# Patient Record
Sex: Female | Born: 1960
Health system: Southern US, Community
[De-identification: ages and names within clinical notes are randomized; demographics above are authoritative.]

## PROBLEM LIST (undated history)

## (undated) DIAGNOSIS — J302 Other seasonal allergic rhinitis: Secondary | ICD-10-CM

## (undated) DIAGNOSIS — K5730 Diverticulosis of large intestine without perforation or abscess without bleeding: Secondary | ICD-10-CM

## (undated) DIAGNOSIS — K219 Gastro-esophageal reflux disease without esophagitis: Secondary | ICD-10-CM

## (undated) DIAGNOSIS — F439 Reaction to severe stress, unspecified: Secondary | ICD-10-CM

## (undated) DIAGNOSIS — G510 Bell's palsy: Secondary | ICD-10-CM

## (undated) DIAGNOSIS — N816 Rectocele: Secondary | ICD-10-CM

## (undated) DIAGNOSIS — R109 Unspecified abdominal pain: Secondary | ICD-10-CM

## (undated) HISTORY — DX: Unspecified abdominal pain: R10.9

## (undated) HISTORY — DX: Reaction to severe stress, unspecified: F43.9

## (undated) HISTORY — DX: Diverticulosis of large intestine without perforation or abscess without bleeding: K57.30

## (undated) HISTORY — PX: TONSILLECTOMY: SUR1361

## (undated) HISTORY — DX: Bell's palsy: G51.0

## (undated) HISTORY — DX: Gastro-esophageal reflux disease without esophagitis: K21.9

## (undated) HISTORY — DX: Rectocele: N81.6

## (undated) HISTORY — DX: Other seasonal allergic rhinitis: J30.2

---

## 1996-02-12 HISTORY — PX: SIGMOIDOSCOPY: SUR1295

## 2003-07-15 ENCOUNTER — Encounter: Admission: RE | Admit: 2003-07-15 | Discharge: 2003-07-15 | Payer: Self-pay | Admitting: Unknown Physician Specialty

## 2004-10-25 ENCOUNTER — Ambulatory Visit (HOSPITAL_COMMUNITY): Admission: RE | Admit: 2004-10-25 | Discharge: 2004-10-25 | Payer: Self-pay | Admitting: Unknown Physician Specialty

## 2005-11-18 ENCOUNTER — Encounter: Admission: RE | Admit: 2005-11-18 | Discharge: 2005-11-18 | Payer: Self-pay | Admitting: Unknown Physician Specialty

## 2007-01-20 ENCOUNTER — Encounter: Admission: RE | Admit: 2007-01-20 | Discharge: 2007-01-20 | Payer: Self-pay | Admitting: Unknown Physician Specialty

## 2007-12-23 ENCOUNTER — Ambulatory Visit: Payer: Self-pay | Admitting: Internal Medicine

## 2008-01-10 HISTORY — PX: COLONOSCOPY: SHX174

## 2008-01-11 ENCOUNTER — Ambulatory Visit: Payer: Self-pay | Admitting: Internal Medicine

## 2008-01-11 ENCOUNTER — Ambulatory Visit (HOSPITAL_COMMUNITY): Admission: RE | Admit: 2008-01-11 | Discharge: 2008-01-11 | Payer: Self-pay | Admitting: Internal Medicine

## 2008-01-11 HISTORY — PX: ESOPHAGOGASTRODUODENOSCOPY: SHX1529

## 2008-01-27 ENCOUNTER — Encounter: Admission: RE | Admit: 2008-01-27 | Discharge: 2008-01-27 | Payer: Self-pay | Admitting: Unknown Physician Specialty

## 2008-02-12 ENCOUNTER — Ambulatory Visit: Payer: Self-pay | Admitting: Internal Medicine

## 2008-02-23 ENCOUNTER — Encounter: Admission: RE | Admit: 2008-02-23 | Discharge: 2008-02-23 | Payer: Self-pay | Admitting: Otolaryngology

## 2008-06-23 ENCOUNTER — Ambulatory Visit (HOSPITAL_COMMUNITY): Admission: RE | Admit: 2008-06-23 | Discharge: 2008-06-23 | Payer: Self-pay | Admitting: Neurology

## 2008-06-29 ENCOUNTER — Encounter (HOSPITAL_COMMUNITY): Admission: RE | Admit: 2008-06-29 | Discharge: 2008-07-29 | Payer: Self-pay | Admitting: Neurology

## 2009-03-06 ENCOUNTER — Encounter: Admission: RE | Admit: 2009-03-06 | Discharge: 2009-03-06 | Payer: Self-pay | Admitting: Unknown Physician Specialty

## 2010-03-27 ENCOUNTER — Encounter
Admission: RE | Admit: 2010-03-27 | Discharge: 2010-03-27 | Payer: Self-pay | Source: Home / Self Care | Attending: Obstetrics and Gynecology | Admitting: Obstetrics and Gynecology

## 2010-04-01 ENCOUNTER — Encounter: Payer: Self-pay | Admitting: Unknown Physician Specialty

## 2010-07-24 NOTE — Assessment & Plan Note (Signed)
NAME:  Dana Clarke, Dana Clarke               CHART#:  [id number]   DATE:  02/12/2008                       DOB:  1960-12-15   Follow up right-sided abdominal pain, intermittent nausea and vomiting.  EGD and ileocolonoscopy on 01/11/2008, demonstrated a small hiatal  hernia and some antral erosions, minimal friable anal rectum, otherwise  normal rectum, pancolonic diverticulum in terminal ileum.  We started  her on Kapidex 60 mg orally daily.  This was associated with marked  improvement in her symptoms nearly complete resolution; however, she ran  out of medication, at the current time she is back on Kapidex.  We  called the prescription in.  She has had some major problems with Bell  palsy and recurrent sinus infections.  She has been on multiple rounds  of antibiotics under the direction of [facility].  She has  had constipation since we performed colonoscopy and says sometimes she  has to digitalize herself to help facilitate a bowel movement.  She  certainly may have a rectocele.  She has been taking over-the-counter  stool softeners.   CURRENT MEDICATIONS:  See updated list.   ALLERGIES:  Aspirin.   PHYSICAL EXAMINATION:  GENERAL:  Today, she appears well.  VITAL SIGNS:  Weight 181, height 5 feet 5 inches, temperature 97.2, BP  118/70, and pulse 84.  SKIN:  Warm and dry.  ABDOMEN:  Flat.  Positive bowel sounds.  Soft and entirely nontender  without appreciable mass or organomegaly.   ASSESSMENT:  Nonspecific abdominal pain follow somewhat to the realm of  dyspepsia.  She has had some recent constipation and recently has  pancolonic diverticulum on recent colonoscopy.  She had a nice dramatic  response to Kapidex 60 mg orally daily, indicating a acid-associated  process.   RECOMMENDATIONS:  1. Would continue Kapidex 60 mg orally daily before breakfast for the      time being.  2. Would add MiraLax 17 g orally at bedtime to regimen for      constipation.  Unless  something comes up, I plan to see this nice      lady back in the office in 3 months.       Jonathon Bellows, M.D.  Electronically Signed     RMR/MEDQ  D:  02/12/2008  T:  02/13/2008  Job:  [id number]   cc:   [facility]

## 2010-07-24 NOTE — Op Note (Signed)
NAME:  Dana Clarke, Dana Clarke              ACCOUNT NO.:  [id number]   MEDICAL RECORD NO.:  [id number]          PATIENT TYPE:  AMB   LOCATION:  DAY                           FACILITY:  [facility]   PHYSICIAN:  R. Roetta Sessions, M.D. DATE OF BIRTH:  Aug 25, 1960   DATE OF PROCEDURE:  01/11/2008  DATE OF DISCHARGE:                               OPERATIVE REPORT   PROCEDURE:  Esophagogastroduodenoscopy diagnostic followed by  ileocolonoscopy.   INDICATIONS FOR PROCEDURE:  A 50 year old lady with longstanding  intermittent right-sided abdominal pain for most part below her  umbilicus on the right, had an extensive evaluation over the years.  She  has intermittent nausea and vomiting without any relative findings on  multitude studies and gallbladder and appendix were in situ.  She is  Hemoccult positive in the office recently.  EGD and colonoscopy  recommended to further evaluate her symptoms.  Risks, benefits,  alternatives, and limitations have been reviewed.  Please see  documentation on the medical record.   PROCEDURE NOTE:  O2 saturation, blood pressure, pulse, and respirations  were monitored throughout the entirety of both procedures.   CONSCIOUS SEDATION:  Versed 7 mg IV and Demerol 125 mg IV in divided  doses.  Cetacaine spray for topical pharyngeal anesthesia.   INSTRUMENTATION:  Pentax video chip system.   FINDINGS:  EGD examination of tubular esophagus revealed no mucosal  abnormality.  EG junction was easily traversed.  Stomach:  Gas cavity  was empty and insufflated well with air.  A thorough examination of the  gastric mucosa including retroflexion view of the proximal stomach and  esophagogastric junction demonstrated only a hiatal hernia and a couple  of tiny antral erosions.  Pylorus was patent and easily traversed.  Examination of the bulb and second portion revealed no abnormalities.   THERAPEUTIC/DIAGNOSTIC MANEUVERS PERFORMED:  None.   The patient tolerated the procedure  well and it was reactive  colonoscopy.  Digital rectal exam revealed no abnormalities.  The scope  was placed.  The prep was adequate.  Colon:  Colonic mucosa was surveyed  from the rectosigmoid junction through the left transverse right colon  to the appendiceal orifice, ileocecal valve, and cecum.  These  structures were well seen and photographed for the record.  Terminal  ileum was intubated to 5 cm.  From this level, the scope was slowly  cautiously withdrawn.  All previous mentioned mucosal surfaces were  again seen.  The patient has scattered pancolonic diverticulum in the  colonic mucosa.  Terminal mucosa appeared entirely normal.  The scope  was pulled down the rectum where thorough examination of the rectal  mucosa including retroflexed view of the anal verge demonstrated  minimally friable anal canal only.  The patient tolerated the procedure  well.  It was reactive endoscopy.   IMPRESSION:  Esophagogastroduodenoscopy.  Normal esophagus, small hiatal  hernia, tiny antral erosions.  Otherwise unremarkable gastric mucosa,  patent pylorus, normal D1 and D2.   COLONOSCOPY FINDINGS:  Friable anal canal, otherwise normal rectum,  pancolonic diverticula.  Remainder of the colonic mucosa and terminal  mucosa appeared normal.  RECOMMENDATIONS:  A trial of proton pump inhibitor therapy in the way of  Capadex or Aciphex 20 or 60 mg respectively daily.  She will get it from  my office free samples.  I will reassess her symptoms in 1 month.  If  she is significantly improved, we will discuss further the possibility  of a diagnostic laparoscopy with incidental appendectomy.      Jonathon Bellows, M.D.  Electronically Signed     RMR/MEDQ  D:  01/11/2008  T:  01/12/2008  Job:  [id number]   cc:   Ruthy Dick  Fax: [phone]   Kirstie Peri, MD  Fax: [phone]

## 2010-07-24 NOTE — Consult Note (Signed)
NAME:  Dana Clarke, Dana Clarke              ACCOUNT NO.:  [id number]   MEDICAL RECORD NO.:  [id number]           PATIENT TYPE:  AMB   LOCATION:  DAY                           FACILITY:  [facility]   PHYSICIAN:  R. Roetta Sessions, M.D. DATE OF BIRTH:  [birth year]   DATE OF CONSULTATION:  DATE OF DISCHARGE:                                 CONSULTATION   REFERRING PHYSICIAN:  Kirstie Peri, MD   REASON FOR CONSULTATION:  Right-sided abdominal pain, nausea, and  vomiting.   HISTORY OF PRESENT ILLNESS:  [name] is a very pleasant 50-  year-old Caucasian female referred on courtesy by Dr. Sherryll Burger to evaluate 2-  month history of intermittent right mid, right lower quadrant abdominal  pain with intermittent associated abdominal distention, nausea, and  vomiting.  She has had symptoms intermittently over the past 2 months.  She was hospitalized for about 4 days at [facility], saw Dr.  Dionicia Abler.  She has had ultrasound of abdomen, CT and MRI of her back as  well as a HIDA scan in the last several weeks as she reports without a  cause for symptoms being found, they do wax and wane, sometimes she can  have some right mid abdominal pain when she steps out of the car and it  subsides.  There is not a radicular component.  She has never developed  a rash.  She denies any alterations in her bowel habits, although she  occasionally wipes a small amount of blood after she has had a bowel  movement.  There has  been no melena.  Again when she has these symptoms  with associated distension, she has nausea and vomiting.  HIDA scan with  a fatty-meal challenge demonstrated gallbladder EF of 90%.  She states  that she got a little nauseated after the half and half but denies that  it reproduced all of her symptoms.  She has never had any intraabdominal  or GYN surgeries.  It is notable in August 2009, she had a recalcitrant  sinus infection and developed Bell palsy.  She took three rounds of  antibiotics as  well as some prednisone around the onset of the above-  mentioned symptoms.   It is notable that this lady has been seen by me back in the late 90s  for right upper quadrant abdominal pain and gallbladder also at that  time revealed no abnormalities.  HIDA demonstrated a gallbladder EF of  53%.  She has not had her upper GI tract evaluated as far as it can be  ascertained.  She did have a flexible sigmoidoscopy for constipation  back in 1997.  She was found have internal hemorrhoids and normal colon  up to 35 cm, an air contrast barium enema followed which revealed only  diverticulosis.  There is no family history of GI neoplasia.  She denies  any weight loss recently.  No odynophagia.  No dysphagia.  No early  satiety.  She does not use any nonsteroidal agents.  She rarely consumes  alcohol.   She tells me that she saw Dr. Marry Guan for  her annual GYN evaluation,  had a negative Pap smear/pelvic in May 2009.   [name] tells me that occasionally, she senses that her right side  abdominal pain as worsened with eating, this tends to eat less.  The  symptoms are seen to be less severe.  Occasionally, she is awakened from  a sound sleep at night with right lower quadrant abdominal pain/right  mid abdominal pain.   PAST MEDICAL HISTORY:  Significant for recurrent right-sided abdominal  pain.  Her only surgeries include tonsillectomy and sigmoidoscopy.   CURRENT MEDICATIONS:  1. Depo-Provera injection every 3 months.  2. Tylenol p.r.n.  3. Vicodin p.r.n.  4. Ambien 10 mg one-quarter tablet at bedtime.  5. She takes probiotic and occasionally takes Zantac for her      indigestion.   ALLERGIES:  No known drug allergies.   FAMILY HISTORY:  No history of chronic GI or liver illness.  Mother  passed when she was 74 with heart related problems.  Father died at age  57 with COPD.   SOCIAL HISTORY:  The patient is single.  She works for the post [employer]  in [place].  No tobacco,  rarely consumes alcohol.  No illicit drugs.   REVIEW OF SYSTEMS:  No chest pain and dyspnea on exertion.  No change in  weight.  No fever, chills, or night sweats.   PHYSICAL EXAMINATION:  GENERAL:  A pleasant 50 year old lady resting  comfortably.  VITAL SIGNS:  Weight 180, height of 5 feet 5 inches, temperature 98.2,  BP 110/74, and pulse 72.  SKIN:  Warm and dry.  HEENT:  No scleral icterus.  Conjunctivae pink.  CHEST:  Lungs are clear to auscultation.  CARDIAC:  Regular rate and rhythm without murmur, gallop, or rub.  BREAST:  Deferred.  ABDOMEN:  Somewhat obese, positive bowel sounds.  She has some vague  right upper mid and right lower quadrant abdominal pain on deep  palpation.  No appreciable mass or organomegaly.  EXTREMITIES:  No edema.  RECTAL:  No external lesions.  No mass at the rectal vault appreciated.  No stool in rectal vault.  Mucus is a hemoccult positive.   IMPRESSION:  [name] is a very pleasant 50 year old lady who  has varying degrees of right-sided abdominal pain for over a decade.  She has had recurrence of symptoms over the past two months which she  differentiate somewhat from her pain that she had done back in the 90s.  It sounds as though she has had a multitude of diagnostic studies and  saw Dr. Dionicia Abler when she was hospitalized briefly recently as well.   At this point, her some symptoms  not at all sound that they are  emanating from her biliary tract.  It will be hard press to recommend  that she get her gallbladder at this point in time.  It almost sounds  like she may be having intermittent low grade small-bowel obstruction.  It is also possible that she may have an element of chronic  appendicitis.  At any rate, her symptomatology has been over a  relatively long period of time and is somewhat reassuring that no  significant pathology has been found to date.  Her symptoms will be  somewhat atypical for peptic ulcer disease, although  she has not had her  upper GI tract evaluated.   She is hemoccult positive today and is almost at threshold age for  colorectal cancer screening.   RECOMMENDATIONS:  I  have told [name] we ought to go ahead and  perform both an EGD and an ileal colonoscopy in the near future at [facility].  Risks, benefits, and alternatives have been reviewed.  If endoscopic evaluation is unrevealing, after I have reviewed the CT  scan from [facility], we may be looking at ultimately offering  this lady a diagnostic laparoscopy with incidental appendectomy to  complete the workup/evaluation of her GI symptoms.  Risks, benefits,  alternatives, and limitations of EGD and colonoscopy have been reviewed  with [name], questions are answered.  She is agreeable.  We will  plan to perform EGD and colonoscopy in near future at [facility].   I thank Dr. Sherryll Burger for allowing me to see this very nice lady once again  in consultation.      Jonathon Bellows, M.D.  Electronically Signed     RMR/MEDQ  D:  12/23/2007  T:  12/24/2007  Job:  [id number]   cc:   Kirstie Peri, MD  Fax: [phone]

## 2011-05-13 ENCOUNTER — Other Ambulatory Visit: Payer: Self-pay | Admitting: Family Medicine

## 2011-05-13 DIAGNOSIS — Z1231 Encounter for screening mammogram for malignant neoplasm of breast: Secondary | ICD-10-CM

## 2011-05-27 ENCOUNTER — Ambulatory Visit
Admission: RE | Admit: 2011-05-27 | Discharge: 2011-05-27 | Disposition: A | Discharge status: Home / Self Care | Payer: Federal, State, Local not specified - PPO | Source: Ambulatory Visit | Attending: Family Medicine | Admitting: Family Medicine

## 2011-05-27 ENCOUNTER — Other Ambulatory Visit: Payer: Self-pay | Admitting: Unknown Physician Specialty

## 2011-05-27 DIAGNOSIS — Z1231 Encounter for screening mammogram for malignant neoplasm of breast: Secondary | ICD-10-CM

## 2011-08-06 ENCOUNTER — Telehealth: Payer: Self-pay

## 2011-08-06 NOTE — Telephone Encounter (Signed)
Pt called- she is having some problems with constipation and diarrhea, she has been taking stool softeners. . She has seen her OB/Gyn recently and he did a rectal exam and told her that she has a place in her rectum that is about the size of a dime, he recommended she try miralax. She has not noticed any blood but thinks she may have had something that looked like coffee grounds in her stool. Advised pt that she has not been seen since 2009 and she will need an ov.   Darl Pikes, please schedule pt for ov.

## 2011-08-07 NOTE — Telephone Encounter (Signed)
Pt is aware of OV on 6/11 at 1030 with LSL

## 2011-08-14 ENCOUNTER — Encounter: Payer: Self-pay | Admitting: Internal Medicine

## 2011-08-20 ENCOUNTER — Ambulatory Visit (INDEPENDENT_AMBULATORY_CARE_PROVIDER_SITE_OTHER): Payer: Federal, State, Local not specified - PPO | Admitting: Gastroenterology

## 2011-08-20 ENCOUNTER — Encounter: Payer: Self-pay | Admitting: Gastroenterology

## 2011-08-20 VITALS — BP 119/68 | HR 57 | Temp 98.2°F | Ht 64.0 in | Wt 206.4 lb

## 2011-08-20 DIAGNOSIS — N816 Rectocele: Secondary | ICD-10-CM

## 2011-08-20 DIAGNOSIS — K625 Hemorrhage of anus and rectum: Secondary | ICD-10-CM

## 2011-08-20 DIAGNOSIS — K5900 Constipation, unspecified: Secondary | ICD-10-CM

## 2011-08-20 DIAGNOSIS — R197 Diarrhea, unspecified: Secondary | ICD-10-CM

## 2011-08-20 NOTE — Patient Instructions (Signed)
Please take Miralax 17g (one capful) daily for next 30 days. You can hold for diarrhea. Please keep stool diary documenting number of stool daily, if formed or loose, if you had to help the stool out by applying pressure.

## 2011-08-20 NOTE — Assessment & Plan Note (Signed)
Rare BRBPR with passing hard stool.

## 2011-08-20 NOTE — Progress Notes (Signed)
Faxed to PCP

## 2011-08-20 NOTE — Progress Notes (Signed)
Primary Care Physician:  Kirstie Peri, MD, MD  Primary Gastroenterologist:  Roetta Sessions, MD   Chief Complaint  Patient presents with  . Follow-up    HPI:  Dana Clarke is a 51 y.o. female here for evaluation of abnormal rectal exam by her gynecologist, Dr. Mora Appl. Patient made appointment herself, not referred.  Patient reports taking Colace off and on for years. If forget stool softner, then may have constipation. She may go from normal bowel movement to hard BM. Usually have 2-3 loose stools following hard stools. She finds that she is not emptying completely and often applies pressure between the vagina and anus in perineal area to facilitate bowel movements. Has to do this even when stools are loose. Occasional BRBPR with constipation. Still having some RLQ pain but not as bad. Sometimes worse with constipation. Occ heartburn, managed with TUMS. No dysphagia. No vomiting. Weight up 25 pounds since 2009. She reports weight gain after on steroids for about a year. Has been unable to loose the weight. C/O stress related to having three young children in the house. She takes care of them while their mother (patient's daughter) is at work on 2nd shift.    Current Outpatient Prescriptions  Medication Sig Dispense Refill  . cetirizine (ZYRTEC) 10 MG chewable tablet Chew 10 mg by mouth daily.      . citalopram (CELEXA) 20 MG tablet Take 20 mg by mouth daily.       . medroxyPROGESTERone (DEPO-PROVERA) 150 MG/ML injection Inject 150 mg into the muscle every 3 (three) months.       . zolpidem (AMBIEN) 10 MG tablet Take 10 mg by mouth at bedtime as needed. 1/4 of a pill        Allergies as of 08/20/2011 - Review Complete 08/20/2011  Allergen Reaction Noted  . Codeine Itching 08/20/2011    Past Medical History  Diagnosis Date  . Abdominal pain     chronic right sided abd pain, gallbladder w/u negative in 2009 (u/s and HIDA)  . Bell's palsy   . Stress     Past Surgical History    Procedure Date  . Esophagogastroduodenoscopy 01/11/2008     Normal esophagus, small hiatal hernia, tiny antral erosions.  Otherwise unremarkable gastric mucosa, patent pylorus, normal D1 and D2.   . Tonsillectomy   . Sigmoidoscopy 02/12/96    interenal hemorrhoids  . Colonoscopy 01/2008    friable anal canal, pancolonic diverticula, normal terminal ileium    Family History  Problem Relation Age of Onset  . COPD Father     deceased age 88  . Heart disease Mother     deceased age 80s  . Diabetes Mother   . Colon cancer Neg Hx   . Cirrhosis Brother     etoh  . Breast cancer Maternal Aunt     2 maternal great aunt.  . Breast cancer Paternal Aunt     History   Social History  . Marital Status: Divorced    Spouse Name: N/A    Number of Children: 3  . Years of Education: N/A   Occupational History  .  [employer]   Social History Main Topics  . Smoking status: Never Smoker   . Smokeless tobacco: Not on file  . Alcohol Use: Yes     2 glass of wine a week  . Drug Use: No  . Sexually Active: Not on file   Other Topics Concern  . Not on file   Social History  Narrative  . No narrative on file      ROS:  General: Negative for anorexia, weight loss, fever, chills, fatigue, weakness. Eyes: Negative for vision changes.  ENT: Negative for hoarseness, difficulty swallowing , nasal congestion. CV: Negative for chest pain, angina, palpitations, dyspnea on exertion, peripheral edema.  Respiratory: Negative for dyspnea at rest, dyspnea on exertion, cough, sputum, wheezing.  GI: See history of present illness. GU:  Negative for dysuria, hematuria, urinary incontinence, urinary frequency, nocturnal urination.  MS: Negative for joint pain, low back pain.  Derm: Negative for rash or itching.  Neuro: Negative for weakness, abnormal sensation, seizure, frequent headaches, memory loss, confusion.  Psych: Negative for anxiety, depression, suicidal ideation, hallucinations.   Endo: Negative for unusual weight change.  Heme: Negative for bruising or bleeding. Allergy: Negative for rash or hives.    Physical Examination:  BP 119/68  Pulse 57  Temp(Src) 98.2 F (36.8 C) (Temporal)  Ht 5\' 4"  (1.626 m)  Wt 206 lb 6.4 oz (93.622 kg)  BMI 35.43 kg/m2   General: Well-nourished, well-developed in no acute distress.  Head: Normocephalic, atraumatic.   Eyes: Conjunctiva pink, no icterus. Mouth: Oropharyngeal mucosa moist and pink , no lesions erythema or exudate. Neck: Supple without thyromegaly, masses, or lymphadenopathy.  Lungs: Clear to auscultation bilaterally.  Heart: Regular rate and rhythm, no murmurs rubs or gallops.  Abdomen: Bowel sounds are normal, nontender, nondistended, no hepatosplenomegaly or masses, no abdominal bruits or    hernia , no rebound or guarding.   Rectal: ext hemorrhoids, no inflammation. Nontender, DRE. Small brown secretions heme negative. No masses.  Extremities: No lower extremity edema. No clubbing or deformities.  Neuro: Alert and oriented x 4 , grossly normal neurologically.  Skin: Warm and dry, no rash or jaundice.   Psych: Alert and cooperative, normal mood and affect.

## 2011-08-20 NOTE — Assessment & Plan Note (Signed)
Alternating constipation and diarrhea with incomplete evacuation requiring perineal pressure/hand manipulation. Suspect rectocele. Patient reports that her gyn examined her and found a "hole" from she tore during childbirth.   Add miralax one capful daily to try and keep stools soft and easy to pass. She will keep stool diary. Ifobt. If no improvement in symptoms and/or ifobt positive, we would offer her colonoscopy. OV in 6 weeks.

## 2011-09-18 ENCOUNTER — Ambulatory Visit (INDEPENDENT_AMBULATORY_CARE_PROVIDER_SITE_OTHER): Payer: Federal, State, Local not specified - PPO | Admitting: Gastroenterology

## 2011-09-18 DIAGNOSIS — K625 Hemorrhage of anus and rectum: Secondary | ICD-10-CM

## 2011-09-20 NOTE — Progress Notes (Signed)
Quick Note:  ifobt negative. Keep f/u ov as planned. ______

## 2011-10-07 ENCOUNTER — Ambulatory Visit: Payer: Federal, State, Local not specified - PPO | Admitting: Gastroenterology

## 2011-10-10 NOTE — Progress Notes (Signed)
REVIEWED.  

## 2011-12-24 ENCOUNTER — Ambulatory Visit (HOSPITAL_COMMUNITY)
Admission: RE | Admit: 2011-12-24 | Discharge: 2011-12-24 | Disposition: A | Discharge status: Home / Self Care | Payer: Federal, State, Local not specified - PPO | Source: Ambulatory Visit | Attending: Internal Medicine | Admitting: Internal Medicine

## 2011-12-24 ENCOUNTER — Ambulatory Visit (INDEPENDENT_AMBULATORY_CARE_PROVIDER_SITE_OTHER): Payer: Federal, State, Local not specified - PPO | Admitting: Internal Medicine

## 2011-12-24 ENCOUNTER — Encounter: Payer: Self-pay | Admitting: Internal Medicine

## 2011-12-24 VITALS — BP 125/68 | HR 59 | Temp 97.6°F | Ht 64.0 in | Wt 203.8 lb

## 2011-12-24 DIAGNOSIS — M51379 Other intervertebral disc degeneration, lumbosacral region without mention of lumbar back pain or lower extremity pain: Secondary | ICD-10-CM | POA: Insufficient documentation

## 2011-12-24 DIAGNOSIS — R109 Unspecified abdominal pain: Secondary | ICD-10-CM

## 2011-12-24 DIAGNOSIS — K5730 Diverticulosis of large intestine without perforation or abscess without bleeding: Secondary | ICD-10-CM | POA: Insufficient documentation

## 2011-12-24 DIAGNOSIS — M5137 Other intervertebral disc degeneration, lumbosacral region: Secondary | ICD-10-CM | POA: Insufficient documentation

## 2011-12-24 MED ORDER — IOHEXOL 300 MG/ML  SOLN
100.0000 mL | Freq: Once | INTRAMUSCULAR | Status: AC | PRN
  Administered 2011-12-24: 100 mL via INTRAVENOUS

## 2011-12-24 NOTE — Progress Notes (Signed)
Primary Care Physician:  Kirstie Peri, MD Primary Gastroenterologist:  Dr. Jena Gauss  Pre-Procedure History & Physical: HPI:  Dana Clarke is a 51 y.o. female here for followup chronic constipation and probable rectocele. Bowel symptoms settled down on a regimen of simple Colace. Not taking MiraLax. Complains of recurrence of right-sided abdominal pain which she states she's had since she was 50 years old. This lasted for a longer period time and now is somewhat worse than prior episodes specifically talks about migratory umbilical / right upper quadrant to flank radiation and then in the opposite direction from time to time. Not affected by bowel function or eating. Does not truly have a radicular component. Does not have a positional component. She has her appendix and gallbladder. Was told years ago she had a bulging disc (possible MRI [facility] hospital); no recent CT scanning; negative colonoscopy here in 2009.  She has not lost any weight. Again, pain not affected by eating or fasting. No fever chills or urinary tract symptoms.   Recent digital rectal exam and Hemoccult testing negative through this office.  Past Medical History  Diagnosis Date  . Abdominal pain     chronic right sided abd pain, gallbladder w/u negative in 2009 (u/s and HIDA)  . Bell's palsy   . Stress   . Diverticula of colon   . Hiatal hernia   . Rectocele     Past Surgical History  Procedure Date  . Esophagogastroduodenoscopy 01/11/2008     Normal esophagus, small hiatal hernia, tiny antral erosions.  Otherwise unremarkable gastric mucosa, patent pylorus, normal D1 and D2.   . Tonsillectomy   . Sigmoidoscopy 02/12/96    interenal hemorrhoids  . Colonoscopy 01/2008    friable anal canal, pancolonic diverticula, normal terminal ileium    Prior to Admission medications   Medication Sig Start Date End Date Taking? Authorizing Provider  cetirizine (ZYRTEC) 10 MG chewable tablet Chew 10 mg by mouth daily.   Yes  Historical Provider, MD  citalopram (CELEXA) 20 MG tablet Take 20 mg by mouth daily.  08/19/11  Yes Historical Provider, MD  medroxyPROGESTERone (DEPO-PROVERA) 150 MG/ML injection Inject 150 mg into the muscle every 3 (three) months.  08/19/11  Yes Historical Provider, MD  zolpidem (AMBIEN) 10 MG tablet Take 10 mg by mouth at bedtime as needed. 1/4 of a pill   Yes Historical Provider, MD    Allergies as of 12/24/2011 - Review Complete 12/24/2011  Allergen Reaction Noted  . Codeine Itching 08/20/2011    Family History  Problem Relation Age of Onset  . COPD Father     deceased age 24  . Heart disease Mother     deceased age 31s  . Diabetes Mother   . Colon cancer Neg Hx   . Cirrhosis Brother     etoh  . Breast cancer Maternal Aunt     2 maternal great aunt.  . Breast cancer Paternal Aunt     History   Social History  . Marital Status: Divorced    Spouse Name: N/A    Number of Children: 3  . Years of Education: N/A   Occupational History  .  [employer]   Social History Main Topics  . Smoking status: Never Smoker   . Smokeless tobacco: Not on file  . Alcohol Use: Yes     2 glass of wine a week  . Drug Use: No  . Sexually Active: Not on file   Other Topics Concern  . Not  on file   Social History Narrative  . No narrative on file    Review of Systems: See HPI, otherwise negative ROS  Physical Exam: BP 125/68  Pulse 59  Temp 97.6 F (36.4 C) (Temporal)  Ht 5\' 4"  (1.626 m)  Wt 203 lb 12.8 oz (92.443 kg)  BMI 34.98 kg/m2 General:   Obese Alert,  Well-developed, well-nourished, pleasant and cooperative in NAD Skin:  Intact without significant lesions or rashes. Eyes:  Sclera clear, no icterus.   Conjunctiva pink. Ears:  Normal auditory acuity. Nose:  No deformity, discharge,  or lesions. Mouth:  No deformity or lesions. Neck:  Supple; no masses or thyromegaly. No significant cervical adenopathy. Lungs:  Clear throughout to auscultation.   No wheezes,  crackles, or rhonchi. No acute distress. Heart:  Regular rate and rhythm; no murmurs, clicks, rubs,  or gallops. Abdomen: Non-distended, normal bowel sounds.  Soft and nontender without appreciable mass or hepatosplenomegaly.  Pulses:  Normal pulses noted. Extremities:  Without clubbing or edema. No CVA tenderness. Impression/Plan:  A 51 year old lady with chronic constipation now with recurrence of right-sided abdominal and flank pain. Symptoms somewhat non-specific. Does not truly sound radicular in origin nor does it sound musculoskeletal primarily. Adhesive disease and/or chronic appendicitis would remain in the differential. I doubt gallbladder pathology. An occult hernia would remain in the differential. It's been years since she last had a CT scan or other cross-sectional abdominal imaging  Recommendations: Contrast CT of the abdomen and pelvis in the near future. Retrieve the MRI spine report done at [facility] do previously  Further recommendations to follow.

## 2011-12-24 NOTE — Patient Instructions (Addendum)
IV/oral contrast CT of abdomen and pelvis to further evaluate right sided abdominal pain.  Need MRI spine report done at [facility] a few years ago.

## 2012-01-06 ENCOUNTER — Telehealth: Payer: Self-pay | Admitting: *Deleted

## 2012-01-06 NOTE — Telephone Encounter (Signed)
CT shows no intra-abdominal abnormalities to explain right-sided abdominal pain. She does have an abnormal spine beginning at L5-too low, however, to explain right-sided abdominal pain.  I do not see a copy of the MRI report from [facility] hospital done a little while back in our EMR. Patient should have an early interval followup with one of the extenders in the next 1-2 weeks (3 constipation, pain, etc.).

## 2012-01-06 NOTE — Telephone Encounter (Signed)
Routed to RMR 

## 2012-01-06 NOTE — Telephone Encounter (Signed)
Dana Clarke called today regarding her results from her recent CT scan.  She would like a call back, asap. Thanks.

## 2012-01-07 NOTE — Telephone Encounter (Signed)
Tried to call pt- LMOM 

## 2012-01-13 ENCOUNTER — Telehealth: Payer: Self-pay

## 2012-01-13 NOTE — Telephone Encounter (Signed)
Pt called back today to get the results of her CT scan. I gave her the results of the CT. She is still hurting a little bit. She did have some x-rays at [facility] that we was trying to get.

## 2012-02-04 ENCOUNTER — Encounter: Payer: Self-pay | Admitting: Internal Medicine

## 2012-02-04 NOTE — Telephone Encounter (Signed)
Mailed appt card to patient with OV on 12/5 at 0930 with AS

## 2012-02-12 ENCOUNTER — Encounter: Payer: Self-pay | Admitting: Internal Medicine

## 2012-02-13 ENCOUNTER — Ambulatory Visit: Payer: Federal, State, Local not specified - PPO | Admitting: Gastroenterology

## 2012-03-16 LAB — COMPREHENSIVE METABOLIC PANEL
ALT: 14 U/L (ref 7–35)
AST: 12 U/L
Albumin: 3.8
Alkaline Phosphatase: 53 U/L
Total Bilirubin: 0.4 mg/dL
Total Protein: 6.6 g/dL

## 2012-03-16 LAB — CBC
HCT: 38 %
MCHC: 32
RDW: 12.8
platelet count: 262

## 2012-04-06 ENCOUNTER — Ambulatory Visit: Payer: Federal, State, Local not specified - PPO | Admitting: Gastroenterology

## 2012-04-10 ENCOUNTER — Encounter: Payer: Self-pay | Admitting: Internal Medicine

## 2012-04-13 ENCOUNTER — Ambulatory Visit (INDEPENDENT_AMBULATORY_CARE_PROVIDER_SITE_OTHER): Payer: Federal, State, Local not specified - PPO | Admitting: Gastroenterology

## 2012-04-13 ENCOUNTER — Encounter: Payer: Self-pay | Admitting: Gastroenterology

## 2012-04-13 VITALS — BP 119/71 | HR 70 | Temp 97.0°F | Ht 64.5 in | Wt 204.2 lb

## 2012-04-13 DIAGNOSIS — R109 Unspecified abdominal pain: Secondary | ICD-10-CM

## 2012-04-13 DIAGNOSIS — K219 Gastro-esophageal reflux disease without esophagitis: Secondary | ICD-10-CM | POA: Insufficient documentation

## 2012-04-13 NOTE — Patient Instructions (Addendum)
Please review the low-fat diet and the reflux diet.  We will see you back in 3 months.  Please call if things do not start to improve in the near future, and I will see you sooner  Diet for Gastroesophageal Reflux Disease, Adult Reflux (acid reflux) is when acid from your stomach flows up into the esophagus. When acid comes in contact with the esophagus, the acid causes irritation and soreness (inflammation) in the esophagus. When reflux happens often or so severely that it causes damage to the esophagus, it is called gastroesophageal reflux disease (GERD). Nutrition therapy can help ease the discomfort of GERD. FOODS OR DRINKS TO AVOID OR LIMIT  Smoking or chewing tobacco. Nicotine is one of the most potent stimulants to acid production in the gastrointestinal tract.   Caffeinated and decaffeinated coffee and black tea.   Regular or low-calorie carbonated beverages or energy drinks (caffeine-free carbonated beverages are allowed).     Strong spices, such as black pepper, white pepper, red pepper, cayenne, curry powder, and chili powder.   Peppermint or spearmint.   Chocolate.   High-fat foods, including meats and fried foods. Extra added fats including oils, butter, salad dressings, and nuts. Limit these to less than 8 tsp per day.   Fruits and vegetables if they are not tolerated, such as citrus fruits or tomatoes.   Alcohol.   Any food that seems to aggravate your condition.  If you have questions regarding your diet, call your caregiver or a registered dietitian. OTHER THINGS THAT MAY HELP GERD INCLUDE:    Eating your meals slowly, in a relaxed setting.   Eating 5 to 6 small meals per day instead of 3 large meals.   Eliminating food for a period of time if it causes distress.   Not lying down until 3 hours after eating a meal.   Keeping the head of your bed raised 6 to 9 inches (15 to 23 cm) by using a foam wedge or blocks under the legs of the bed. Lying flat may make  symptoms worse.   Being physically active. Weight loss may be helpful in reducing reflux in overweight or obese adults.   Wear loose fitting clothing  EXAMPLE MEAL PLAN This meal plan is approximately 2,000 calories based on https://www.bernard.org/ meal planning guidelines. Breakfast   cup cooked oatmeal.   1 cup strawberries.   1 cup low-fat milk.   1 oz almonds.  Snack  1 cup cucumber slices.   6 oz yogurt (made from low-fat or fat-free milk).  Lunch  2 slice whole-wheat bread.   2 oz sliced [place].   2 tsp mayonnaise.   1 cup blueberries.   1 cup snap peas.  Snack  6 whole-wheat crackers.   1 oz string cheese.  Dinner   cup brown rice.   1 cup mixed veggies.   1 tsp olive oil.   3 oz grilled fish.  Document Released: 02/25/2005 Document Revised: 05/20/2011 Document Reviewed: 01/11/2011 [facility] Patient Information 2013 [place], [place].

## 2012-04-13 NOTE — Assessment & Plan Note (Signed)
Intermittent, r/t food choices. Low-fat diet and reflux diet provided. 3 mos return. Hesitant to start PPI, although she very well may need this in the future.

## 2012-04-13 NOTE — Progress Notes (Signed)
Faxed to PCP

## 2012-04-13 NOTE — Assessment & Plan Note (Signed)
52 year old female with hx of chronic abdominal pain, now noting RUQ pain, intermittent, associated nausea. No exacerbation with food. [place] of abdomen without stones, LFTs normal. Lipase normal. Prior work-up for biliary dyskinesia negative in 2009. Unable to rule out biliary component, may be dealing with underlying gastritis but more likely chronic abdominal pain. Interestingly, she was tender to palpation in the lower right ribs, intercostal space. Instructed to follow-up with PCP regarding this. I offered an updated HIDA scan, but she would like to attempt a low-fat diet and reflux diet first. I have provided her both of these handouts. We will see her back in 3 months. She is hesitant to start a PPI.

## 2012-04-13 NOTE — Progress Notes (Signed)
Referring Provider: Kirstie Peri, MD Primary Care Physician:  Kirstie Peri, MD Primary Gastroenterologist: Dr. Jena Gauss   Chief Complaint  Patient presents with  . Abdominal Pain    HPI:    Ms. Hinz is a pleasant 52 year old female who presents today with a hx of constipation, rectocele, and abdominal pain. Last seen by Dr. Jena Gauss in October 2013. At that time, she had noted recurrence of right-sided abdominal pain, which has been present since she was about 12. CT at that time with colonic diverticulosis, severe DDD L5-S1. Nothing to explain her symptoms.   She returns today stating that pain is now RUQ. Radiates to right flank. Went to the ED early January, felt like she was having a baby. Intermittent. No real association with food. +nausea associated with pain. Denies bloating. Appetite is ok, no difference with this. Intermittent heartburn, takes a home remedy. Uses vinegar, water, baking soda. Eats Tums. Maybe a few times per week. Doesn't want to take Prevacid because her mom has osteoporosis. Usually only greasy foods worsen reflux. States had a big BM then diarrhea. Usually has a BM once to three times per day. Sometimes hard then runny, sometimes normal. Not eating high fiber diet. No supplemental fiber.   Has taken Ibuprofen the last few days for carpal tunnel. Takes rarely. No melena or hematochezia.   [place] of abdomen at [facility] Jan 2014: normal gallbladder, no stones, CBD normal. Liver normal.  LFTs normal, lipase normal. CBC without anemia or leukocytosis.   ifobt negative July 2013.   Past Medical History  Diagnosis Date  . Abdominal pain     chronic right sided abd pain, gallbladder w/u negative in 2009 (u/s and HIDA)  . Bell's palsy   . Stress   . Diverticula of colon   . Hiatal hernia   . Rectocele     Past Surgical History  Procedure Date  . Esophagogastroduodenoscopy 01/11/2008     [id number] esophagus, small hiatal hernia, tiny antral erosions.  Otherwise  unremarkable gastric mucosa, patent pylorus, normal D1 and D2.   . Tonsillectomy   . Sigmoidoscopy 02/12/96    interenal hemorrhoids  . Colonoscopy 01/2008    [id number] anal canal, pancolonic diverticula, normal terminal ileium    Current Outpatient Prescriptions  Medication Sig Dispense Refill  . cetirizine (ZYRTEC) 10 MG chewable tablet Chew 10 mg by mouth daily.      . citalopram (CELEXA) 20 MG tablet Take 20 mg by mouth daily.       Marland Kitchen docusate sodium (COLACE) 100 MG capsule Take 100 mg by mouth daily. 3 tablets a day      . loratadine (CLARITIN) 10 MG tablet Take 10 mg by mouth daily.      . medroxyPROGESTERone (DEPO-PROVERA) 150 MG/ML injection Inject 150 mg into the muscle every 3 (three) months.       . zolpidem (AMBIEN) 10 MG tablet Take 10 mg by mouth at bedtime as needed. 1/4 of a pill        Allergies as of 04/13/2012 - Review Complete 04/13/2012  Allergen Reaction Noted  . Codeine Itching 08/20/2011    Family History  Problem Relation Age of Onset  . COPD Father     deceased age 63  . Heart disease Mother     deceased age 68s  . Diabetes Mother   . Colon cancer Neg Hx   . Cirrhosis Brother     etoh  . Breast cancer Maternal Aunt     2 maternal great aunt.  Marland Kitchen  Breast cancer Paternal Aunt     History   Social History  . Marital Status: Divorced    Spouse Name: N/A    Number of Children: 3  . Years of Education: N/A   Occupational History  .  [employer]   Social History Main Topics  . Smoking status: Never Smoker   . Smokeless tobacco: None  . Alcohol Use: Yes     Comment: 2 glass of wine a week  . Drug Use: No  . Sexually Active: None   Other Topics Concern  . None   Social History Narrative  . None    Review of Systems: Gen: SEE HPI CV: Denies chest pain, palpitations, syncope, peripheral edema, and claudication. Resp: +sinus infection  GI: SEE HPI Derm: Denies rash, itching, dry skin Psych: +anxiety, 3 grandkids living with her and  daughter Heme: Denies bruising, bleeding, and enlarged lymph nodes.  Physical Exam: BP 119/71  Pulse 70  Temp 97 F (36.1 C) (Oral)  Ht 5' 4.5" (1.638 m)  Wt 204 lb 3.2 oz (92.625 kg)  BMI 34.51 kg/m2 General:   Alert and oriented. No distress noted. Pleasant and cooperative.  Head:  Normocephalic and atraumatic. Eyes:  Conjuctiva clear without scleral icterus. Mouth:  Oral mucosa pink and moist. Good dentition. No lesions. Heart:  S1, S2 present without murmurs, rubs, or gallops. Regular rate and rhythm. Abdomen:  +BS, soft, mild TTP epigastric, right intercostal margins, and non-distended. No rebound or guarding. No HSM or masses noted. Msk:  Symmetrical without gross deformities. Normal posture. Extremities:  Without edema. Neurologic:  Alert and  oriented x4;  grossly normal neurologically. Skin:  Intact without significant lesions or rashes. Psych:  Alert and cooperative. Normal mood and affect.

## 2012-05-22 ENCOUNTER — Encounter: Payer: Self-pay | Admitting: Internal Medicine

## 2012-07-15 ENCOUNTER — Ambulatory Visit: Payer: Federal, State, Local not specified - PPO | Admitting: Gastroenterology

## 2012-10-08 ENCOUNTER — Other Ambulatory Visit: Payer: Self-pay

## 2012-10-08 ENCOUNTER — Telehealth: Payer: Self-pay | Admitting: *Deleted

## 2012-10-08 DIAGNOSIS — R109 Unspecified abdominal pain: Secondary | ICD-10-CM

## 2012-10-08 DIAGNOSIS — R197 Diarrhea, unspecified: Secondary | ICD-10-CM

## 2012-10-08 DIAGNOSIS — K219 Gastro-esophageal reflux disease without esophagitis: Secondary | ICD-10-CM

## 2012-10-08 NOTE — Telephone Encounter (Signed)
Talked with patient and she said that she will good to [facility] and have the blood work done in the morning. I am going to fax the orders over to them. I also made her an appointment for Tuesday at 11:30. I told her that if it get worst to go to the ER.

## 2012-10-08 NOTE — Telephone Encounter (Signed)
Spoke with pt- she had diarrhea x 3 weeks, she then got slightly constipated. Last night she had greasy food for dinner and woke up at 2am with vomiting and watery diarrhea and abd pain all over (not one specific spot) she feels like she is swollen under her R breast. The only thing she has been able to keep down is a small amount of pepsi. She is concerned about her gallbladder or possible blockage. She wants to be seen soon or wants to know if we can call in something. She said she had a recent gallbladder u/s.  Please advise.

## 2012-10-08 NOTE — Telephone Encounter (Signed)
We can check HFP, lipase, followed bland diet for now. Will likely need HIDA scan in near future.  May offer a visit for next week.  Has hx of chronic abdominal pain.

## 2012-10-08 NOTE — Telephone Encounter (Signed)
Pt called stating she has been having d/v all night and wanting to be seen soon. Please advise.

## 2012-10-13 ENCOUNTER — Ambulatory Visit (INDEPENDENT_AMBULATORY_CARE_PROVIDER_SITE_OTHER): Payer: Federal, State, Local not specified - PPO | Admitting: Gastroenterology

## 2012-10-13 ENCOUNTER — Telehealth: Payer: Self-pay | Admitting: *Deleted

## 2012-10-13 ENCOUNTER — Encounter: Payer: Self-pay | Admitting: Gastroenterology

## 2012-10-13 VITALS — BP 133/78 | HR 54 | Temp 99.2°F | Ht 64.0 in | Wt 203.0 lb

## 2012-10-13 DIAGNOSIS — R109 Unspecified abdominal pain: Secondary | ICD-10-CM

## 2012-10-13 NOTE — Telephone Encounter (Signed)
Received

## 2012-10-13 NOTE — Telephone Encounter (Signed)
Dana Clarke from [facility] cannot find hida scan

## 2012-10-13 NOTE — Patient Instructions (Addendum)
Start taking Dexilant each morning with breakfast.   I am requesting records from [place] before we do any further tests.  We will be in touch shortly!

## 2012-10-13 NOTE — Progress Notes (Signed)
Referring Provider: Kirstie Peri, MD Primary Care Physician:  Kirstie Peri, MD Primary GI: Dr. Jena Gauss   Chief Complaint  Patient presents with  . Abdominal Pain    ? gallbladder    HPI:   52 year old well-known female to our practice, presents today due to abdominal pain. GI history significant for constipation as well. Outside [place] of abdomen Jan 2014 at [facility] normal. Prior work-up for biliary dyskinesia negative in 2009. Last HIDA in 2009 with 92% EF; however, she did experience RUQ pain and reproduction of symptoms after ingesting half and half. Offered HIDA scan at last appt in Feb 2014, but she wanted to attempt a low-fat diet and GERD diet first. Was hesitant to start a PPI at that time. I ordered HFP, lipase July 31 after phone call. Lipase normal at 21. HFP normal. Will be abstracted.   Presents today stating it took a few days to get over the RUQ/upper abdominal pain. Had eaten a bacon, egg, tomato sandwich prior, cooking it in bacon grease. Woke up with vomiting and diarrhea. Resulted in severe soreness. Couldn't move for a few days, occasionally feels swelling in RUQ. Stools softer. Some loose, some solid stool. No constipation. Laxatives cause diarrhea. Prior to episode, had 3 small stools three times a day. Weight is stable. No rectal bleeding. Holds her stool all day until she gets home. Stops drinking while working, then starts drinking again towards the end of the day. Pain intermittent. No reflux unless eating a lot of grease.    Past Medical History  Diagnosis Date  . Abdominal pain     chronic right sided abd pain, gallbladder w/u negative in 2009 (u/s and HIDA)  . Bell's palsy   . Stress   . Diverticula of colon   . Hiatal hernia   . Rectocele     Past Surgical History  Procedure Laterality Date  . Esophagogastroduodenoscopy  01/11/2008     ZOX:[id number] esophagus, small hiatal hernia, tiny antral erosions.  Otherwise unremarkable gastric mucosa, patent pylorus, normal  D1 and D2.   . Tonsillectomy    . Sigmoidoscopy  02/12/96    interenal hemorrhoids  . Colonoscopy  01/2008    WUJ:[id number] anal canal, pancolonic diverticula, normal terminal ileium    Current Outpatient Prescriptions  Medication Sig Dispense Refill  . cetirizine (ZYRTEC) 10 MG chewable tablet Chew 10 mg by mouth daily.      . citalopram (CELEXA) 20 MG tablet Take 20 mg by mouth daily.       Marland Kitchen loratadine (CLARITIN) 10 MG tablet Take 10 mg by mouth daily.      . medroxyPROGESTERone (DEPO-PROVERA) 150 MG/ML injection Inject 150 mg into the muscle every 3 (three) months.       . zolpidem (AMBIEN) 10 MG tablet Take 10 mg by mouth at bedtime as needed. 1/4 of a pill      . docusate sodium (COLACE) 100 MG capsule Take 100 mg by mouth daily. 3 tablets a day       No current facility-administered medications for this visit.    Allergies as of 10/13/2012 - Review Complete 10/13/2012  Allergen Reaction Noted  . Codeine Itching 08/20/2011    Family History  Problem Relation Age of Onset  . COPD Father     deceased age 30  . Heart disease Mother     deceased age 2s  . Diabetes Mother   . Colon cancer Neg Hx   . Cirrhosis Brother     etoh  .  Breast cancer Maternal Aunt     2 maternal great aunt.  . Breast cancer Paternal Aunt     History   Social History  . Marital Status: Divorced    Spouse Name: N/A    Number of Children: 3  . Years of Education: N/A   Occupational History  .  [employer]   Social History Main Topics  . Smoking status: Never Smoker   . Smokeless tobacco: None  . Alcohol Use: Yes     Comment: 2 glass of wine a week  . Drug Use: No  . Sexually Active: None   Other Topics Concern  . None   Social History Narrative  . None    Review of Systems: Negative unless mentioned in HPI.   Physical Exam: BP 133/78  Pulse 54  Temp(Src) 99.2 F (37.3 C) (Oral)  Ht 5\' 4"  (1.626 m)  Wt 203 lb (92.08 kg)  BMI 34.83 kg/m2 General:   Alert and oriented.  No distress noted. Pleasant and cooperative.  Head:  Normocephalic and atraumatic. Eyes:  Conjuctiva clear without scleral icterus. Mouth:  Oral mucosa pink and moist. Good dentition. No lesions. Neck:  Supple, without mass or thyromegaly. Heart:  S1, S2 present without murmurs, rubs, or gallops. Regular rate and rhythm. Abdomen:  +BS, soft, TTP RUQ/right lower rib margin and non-distended. No rebound or guarding. No HSM or masses noted. Msk:  Symmetrical without gross deformities. Normal posture. Extremities:  Without edema. Neurologic:  Alert and  oriented x4;  grossly normal neurologically. Skin:  Intact without significant lesions or rashes. Cervical Nodes:  No significant cervical adenopathy. Psych:  Alert and cooperative. Normal mood and affect.

## 2012-10-14 NOTE — Assessment & Plan Note (Signed)
52 year old female with chronic RUQ pain, highly suspicious for biliary dyskinesia. Recent exacerbation likely secondary to dietary behavior. HFP and lipase results from acute episode are completely normal. [place] normal Jan 2014 at [place]. Last HIDA in 2009 at [facility] with reproduction of symptoms at time of ingesting half and half. Normal EF at 92% at that time.  I have asked patient to trial Dexilant daily. She is hesitant to use a PPI long-term.  At time of visit, I did not have the lab results or HIDA report, so the patient was informed this would be reviewed in detail before further recommendations.  At this time: recommend updated HIDA scan with ensure. If reproduction of symptoms again, would recommend referral for consideration of elective cholecystectomy.

## 2012-10-14 NOTE — Progress Notes (Signed)
Dana Clarke, Please let patient know I received the last HIDA report from [place], and it was in 2009. Last [place] was this year. Her labs are normal to include HFP and lipase.  I recommend a repeat HIDA scan with ENSURE at [facility]. This is the only way to document if she is having biliary type issues. Please see if patient is willing to proceed with this. If so, let's do an updated HIDA.

## 2012-10-15 NOTE — Progress Notes (Signed)
Cc PCP 

## 2012-10-21 LAB — HEPATIC FUNCTION PANEL
Albumin: 3.9
Alkaline Phosphatase: 56 U/L
Bilirubin, Direct: 0.1 mg/dL (ref 0.01–0.4)
Total Protein: 7.2 g/dL

## 2012-10-22 NOTE — Progress Notes (Signed)
Pt aware and is going to call back with a date and time.  Leighann, please schedule when pt calls back.

## 2012-10-26 ENCOUNTER — Encounter: Payer: Self-pay | Admitting: Gastroenterology

## 2012-10-26 NOTE — Progress Notes (Signed)
Patient has not called back, I mailed a letter

## 2013-03-26 ENCOUNTER — Other Ambulatory Visit: Payer: Self-pay

## 2013-03-26 DIAGNOSIS — Z1231 Encounter for screening mammogram for malignant neoplasm of breast: Secondary | ICD-10-CM

## 2013-05-04 ENCOUNTER — Other Ambulatory Visit: Payer: Self-pay

## 2013-05-04 ENCOUNTER — Telehealth: Payer: Self-pay

## 2013-05-04 DIAGNOSIS — R109 Unspecified abdominal pain: Secondary | ICD-10-CM

## 2013-05-04 NOTE — Telephone Encounter (Signed)
Pt is having RUQ pain and thinks she is having a gallbladder attack. She does not have a fever. She is nausea but no vomiting. She can not wait until March to be seen. Please advise

## 2013-05-04 NOTE — Telephone Encounter (Signed)
Pt is coming in on Thursday at 11:30 to see LSL. She also going to have the blood work done today in [place]. I am going to fax the orders over to the hospital.

## 2013-05-04 NOTE — Telephone Encounter (Signed)
Her call back number is [phone]

## 2013-05-04 NOTE — Telephone Encounter (Signed)
Offer her an appointment for this week. Looking through records she has had chronic RUQ pain and has been evaluated for gb disease in past. May need updated u/s vs go straight to HIDA but I would recommend she be seen first.   Recommend LFTs, lipase, CBC today.  If symptoms worsen, persist, she should go to ER for expedited work-up.

## 2013-05-06 ENCOUNTER — Other Ambulatory Visit: Payer: Self-pay

## 2013-05-06 ENCOUNTER — Encounter (HOSPITAL_COMMUNITY): Payer: Self-pay | Admitting: Emergency Medicine

## 2013-05-06 ENCOUNTER — Emergency Department (HOSPITAL_COMMUNITY)
Admission: EM | Admit: 2013-05-06 | Discharge: 2013-05-06 | Disposition: A | Discharge status: Home / Self Care | Payer: Federal, State, Local not specified - PPO | Attending: Emergency Medicine | Admitting: Emergency Medicine

## 2013-05-06 ENCOUNTER — Ambulatory Visit: Payer: Federal, State, Local not specified - PPO | Admitting: Gastroenterology

## 2013-05-06 ENCOUNTER — Telehealth: Payer: Self-pay | Admitting: Internal Medicine

## 2013-05-06 ENCOUNTER — Emergency Department (HOSPITAL_COMMUNITY): Payer: Federal, State, Local not specified - PPO

## 2013-05-06 DIAGNOSIS — Z8669 Personal history of other diseases of the nervous system and sense organs: Secondary | ICD-10-CM | POA: Insufficient documentation

## 2013-05-06 DIAGNOSIS — Z79899 Other long term (current) drug therapy: Secondary | ICD-10-CM | POA: Insufficient documentation

## 2013-05-06 DIAGNOSIS — R0789 Other chest pain: Secondary | ICD-10-CM

## 2013-05-06 DIAGNOSIS — Z9889 Other specified postprocedural states: Secondary | ICD-10-CM | POA: Insufficient documentation

## 2013-05-06 DIAGNOSIS — R071 Chest pain on breathing: Secondary | ICD-10-CM | POA: Insufficient documentation

## 2013-05-06 DIAGNOSIS — Z8719 Personal history of other diseases of the digestive system: Secondary | ICD-10-CM | POA: Insufficient documentation

## 2013-05-06 LAB — CBC WITH DIFFERENTIAL/PLATELET
BASOS ABS: 0 10*3/uL (ref 0.0–0.1)
Basophils Relative: 0 % (ref 0–1)
Eosinophils Absolute: 0.1 10*3/uL (ref 0.0–0.7)
Eosinophils Relative: 2 % (ref 0–5)
HEMATOCRIT: 39.2 % (ref 36.0–46.0)
HEMOGLOBIN: 13.4 g/dL (ref 12.0–15.0)
LYMPHS ABS: 2.5 10*3/uL (ref 0.7–4.0)
LYMPHS PCT: 37 % (ref 12–46)
MCH: 32.1 pg (ref 26.0–34.0)
MCHC: 34.2 g/dL (ref 30.0–36.0)
MCV: 94 fL (ref 78.0–100.0)
MONO ABS: 0.6 10*3/uL (ref 0.1–1.0)
Monocytes Relative: 9 % (ref 3–12)
NEUTROS ABS: 3.4 10*3/uL (ref 1.7–7.7)
Neutrophils Relative %: 51 % (ref 43–77)
Platelets: 280 10*3/uL (ref 150–400)
RBC: 4.17 MIL/uL (ref 3.87–5.11)
RDW: 12.8 % (ref 11.5–15.5)
WBC: 6.7 10*3/uL (ref 4.0–10.5)

## 2013-05-06 LAB — COMPREHENSIVE METABOLIC PANEL
ALT: 16 U/L (ref 0–35)
AST: 18 U/L (ref 0–37)
Albumin: 3.9 g/dL (ref 3.5–5.2)
Alkaline Phosphatase: 64 U/L (ref 39–117)
BILIRUBIN TOTAL: 0.5 mg/dL (ref 0.3–1.2)
BUN: 9 mg/dL (ref 6–23)
CHLORIDE: 104 meq/L (ref 96–112)
CO2: 25 meq/L (ref 19–32)
Calcium: 9.5 mg/dL (ref 8.4–10.5)
Creatinine, Ser: 0.89 mg/dL (ref 0.50–1.10)
GFR calc Af Amer: 85 mL/min — ABNORMAL LOW (ref >90)
GFR calc non Af Amer: 73 mL/min — ABNORMAL LOW (ref >90)
Glucose, Bld: 107 mg/dL — ABNORMAL HIGH (ref 70–99)
Potassium: 4.2 mEq/L (ref 3.7–5.3)
Sodium: 140 mEq/L (ref 137–147)
Total Protein: 7.5 g/dL (ref 6.0–8.3)

## 2013-05-06 LAB — URINE MICROSCOPIC-ADD ON

## 2013-05-06 LAB — URINALYSIS, ROUTINE W REFLEX MICROSCOPIC
Bilirubin Urine: NEGATIVE
GLUCOSE, UA: NEGATIVE mg/dL
Hgb urine dipstick: NEGATIVE
KETONES UR: NEGATIVE mg/dL
Nitrite: NEGATIVE
Protein, ur: NEGATIVE mg/dL
Specific Gravity, Urine: 1.01 (ref 1.005–1.030)
Urobilinogen, UA: 0.2 mg/dL (ref 0.0–1.0)
pH: 6 (ref 5.0–8.0)

## 2013-05-06 LAB — TROPONIN I: Troponin I: <0.3 ng/mL (ref <0.30)

## 2013-05-06 LAB — D-DIMER, QUANTITATIVE: D-Dimer, Quant: <0.27 ug/mL-FEU (ref 0.00–0.48)

## 2013-05-06 LAB — LIPASE, BLOOD: Lipase: 32 U/L (ref 11–59)

## 2013-05-06 MED ORDER — IBUPROFEN 800 MG PO TABS: 800.0000 mg | ORAL_TABLET | Freq: Three times a day (TID) | ORAL | Status: DC

## 2013-05-06 MED ORDER — OXYCODONE-ACETAMINOPHEN 5-325 MG PO TABS
1.0000 | ORAL_TABLET | ORAL | Status: DC | PRN
Start: 2013-05-06 — End: 2015-12-18

## 2013-05-06 NOTE — ED Provider Notes (Signed)
CSN: [id number]     Arrival date & time 05/06/13  1059 History   First MD Initiated Contact with Patient 05/06/13 1102     Chief Complaint  Patient presents with  . Abdominal Pain     (Consider location/radiation/quality/duration/timing/severity/associated sxs/prior Treatment) HPI Comments: Patient presents to the ER for evaluation of pain in the right upper abdominal area radiating into the back. Symptoms began one week ago. She reports the pain has been constant since then. The pain worsens when she eats. She has not noticed any shortness of breath, but has recently had an upper respiratory illness. She reports a fairly severe cough 3 weeks ago which has improved. Patient has nausea but no vomiting. No diarrhea or constipation.  Patient is a 53 y.o. female presenting with abdominal pain.  Abdominal Pain   Past Medical History  Diagnosis Date  . Abdominal pain     chronic right sided abd pain, gallbladder w/u negative in 2009 (u/s and HIDA)  . Bell's palsy   . Stress   . Diverticula of colon   . Hiatal hernia   . Rectocele    Past Surgical History  Procedure Laterality Date  . Esophagogastroduodenoscopy  01/11/2008     HQI:ONGEXBRMR:Normal esophagus, small hiatal hernia, tiny antral erosions.  Otherwise unremarkable gastric mucosa, patent pylorus, normal D1 and D2.   . Tonsillectomy    . Sigmoidoscopy  02/12/96    interenal hemorrhoids  . Colonoscopy  01/2008    MWU:XLKGMWNRMR:friable anal canal, pancolonic diverticula, normal terminal ileium   Family History  Problem Relation Age of Onset  . COPD Father     deceased age 53  . Heart disease Mother     deceased age 2070s  . Diabetes Mother   . Colon cancer Neg Hx   . Cirrhosis Brother     etoh  . Breast cancer Maternal Aunt     2 maternal great aunt.  . Breast cancer Paternal Aunt    History  Substance Use Topics  . Smoking status: Never Smoker   . Smokeless tobacco: Not on file  . Alcohol Use: Yes     Comment: occ   OB History   Grav Para Term Preterm Abortions TAB SAB Ect Mult Living                 Review of Systems  Gastrointestinal: Positive for abdominal pain.  All other systems reviewed and are negative.      Allergies  Codeine  Home Medications   Current Outpatient Rx  Name  Route  Sig  Dispense  Refill  . cetirizine (ZYRTEC) 10 MG chewable tablet   Oral   Chew 10 mg by mouth daily.         . citalopram (CELEXA) 20 MG tablet   Oral   Take 20 mg by mouth daily.          Marland Kitchen. docusate sodium (COLACE) 100 MG capsule   Oral   Take 100 mg by mouth daily. 3 tablets a day         . loratadine (CLARITIN) 10 MG tablet   Oral   Take 10 mg by mouth daily.         . medroxyPROGESTERone (DEPO-PROVERA) 150 MG/ML injection   Intramuscular   Inject 150 mg into the muscle every 3 (three) months.          . zolpidem (AMBIEN) 10 MG tablet   Oral   Take 10 mg by mouth at bedtime as  needed. 1/4 of a pill          BP 133/76  Pulse 60  Temp(Src) 97.9 F (36.6 C) (Oral)  Resp 20  Ht 5\' 4"  (1.626 m)  Wt 205 lb (92.987 kg)  BMI 35.17 kg/m2  SpO2 99% Physical Exam  Constitutional: She is oriented to person, place, and time. She appears well-developed and well-nourished. No distress.  HENT:  Head: Normocephalic and atraumatic.  Right Ear: Hearing normal.  Left Ear: Hearing normal.  Nose: Nose normal.  Mouth/Throat: Oropharynx is clear and moist and mucous membranes are normal.  Eyes: Conjunctivae and EOM are normal. Pupils are equal, round, and reactive to light.  Neck: Normal range of motion. Neck supple.  Cardiovascular: Regular rhythm, S1 normal and S2 normal.  Exam reveals no gallop and no friction rub.   No murmur heard. Pulmonary/Chest: Effort normal and breath sounds normal. No respiratory distress. She exhibits tenderness.    Abdominal: Soft. Normal appearance and bowel sounds are normal. There is no hepatosplenomegaly. There is tenderness in the right upper quadrant and  epigastric area. There is no rebound, no guarding, no tenderness at McBurney's point and negative Murphy's sign. No hernia.  Musculoskeletal: Normal range of motion.  Neurological: She is alert and oriented to person, place, and time. She has normal strength. No cranial nerve deficit or sensory deficit. Coordination normal. GCS eye subscore is 4. GCS verbal subscore is 5. GCS motor subscore is 6.  Skin: Skin is warm, dry and intact. No rash noted. No cyanosis.  Psychiatric: She has a normal mood and affect. Her speech is normal and behavior is normal. Thought content normal.    ED Course  Procedures (including critical care time) Labs Review Labs Reviewed  CBC WITH DIFFERENTIAL  COMPREHENSIVE METABOLIC PANEL  LIPASE, BLOOD  URINALYSIS, ROUTINE W REFLEX MICROSCOPIC  D-DIMER, QUANTITATIVE  TROPONIN I   Imaging Review No results found.  EKG Interpretation   None       Date: 05/06/2013  Rate: 54  Rhythm: normal sinus rhythm  QRS Axis: normal  Intervals: normal  ST/T Wave abnormalities: normal  Conduction Disutrbances:none  Narrative Interpretation: normal EKG     MDM   Final diagnoses:  None   Patient presents to the ER with complaints of pain in the right upper abdominal area has been present for nearly a week. She did indicate that worsened with eating. Examination, however, did not suggest obvious gallbladder disease. She had mild tenderness in the right quadrant, was also tender over the anterior costal margin and lateral revealed on the right side. This raises suspicion for possible musculoskeletal chest wall pain. Patient's cardiac workup was negative. Her EKG and troponin were negative. D-dimer was negative and she had very low pretest likelihood of PE.  Gallbladder ultrasound was performed to further evaluate and was negative. Lab work was all entirely normal. treat with analgesia and rest, followup with primary doctor.    Gilda Crease, MD 05/06/13  [phone]

## 2013-05-06 NOTE — ED Notes (Signed)
Pt c/o pain in RUQ since Friday with intermittent nausea.  No vomiting or diarrhea.

## 2013-05-06 NOTE — Discharge Instructions (Signed)

## 2013-05-06 NOTE — Telephone Encounter (Signed)
Pt called with a week hx of ruq abd pain; different than years past ; was to be seen toady - office closed because of weather; pt states she had lab [facility] a few days ago(no results in chart).  I told her to go to ED.  She can our office in am and get new appt with us as needed

## 2013-05-07 NOTE — Telephone Encounter (Signed)
[facility] patient [facility] and she said she wanted to wait because she is following up with her PCP

## 2015-06-20 DIAGNOSIS — N3289 Other specified disorders of bladder: Secondary | ICD-10-CM | POA: Diagnosis not present

## 2015-07-18 DIAGNOSIS — Z1231 Encounter for screening mammogram for malignant neoplasm of breast: Secondary | ICD-10-CM | POA: Diagnosis not present

## 2015-07-24 DIAGNOSIS — G510 Bell's palsy: Secondary | ICD-10-CM | POA: Diagnosis not present

## 2015-07-26 DIAGNOSIS — R200 Anesthesia of skin: Secondary | ICD-10-CM | POA: Diagnosis not present

## 2015-07-26 DIAGNOSIS — Z6833 Body mass index (BMI) 33.0-33.9, adult: Secondary | ICD-10-CM | POA: Diagnosis not present

## 2015-07-26 DIAGNOSIS — J209 Acute bronchitis, unspecified: Secondary | ICD-10-CM | POA: Diagnosis not present

## 2015-07-26 DIAGNOSIS — J0101 Acute recurrent maxillary sinusitis: Secondary | ICD-10-CM | POA: Diagnosis not present

## 2015-07-26 DIAGNOSIS — R509 Fever, unspecified: Secondary | ICD-10-CM | POA: Diagnosis not present

## 2015-07-26 DIAGNOSIS — R234 Changes in skin texture: Secondary | ICD-10-CM | POA: Diagnosis not present

## 2015-08-06 DIAGNOSIS — J323 Chronic sphenoidal sinusitis: Secondary | ICD-10-CM | POA: Diagnosis not present

## 2015-08-06 DIAGNOSIS — R200 Anesthesia of skin: Secondary | ICD-10-CM | POA: Diagnosis not present

## 2015-08-09 DIAGNOSIS — G4700 Insomnia, unspecified: Secondary | ICD-10-CM | POA: Diagnosis not present

## 2015-08-09 DIAGNOSIS — R030 Elevated blood-pressure reading, without diagnosis of hypertension: Secondary | ICD-10-CM | POA: Diagnosis not present

## 2015-08-09 DIAGNOSIS — Z6833 Body mass index (BMI) 33.0-33.9, adult: Secondary | ICD-10-CM | POA: Diagnosis not present

## 2015-08-09 DIAGNOSIS — M545 Low back pain: Secondary | ICD-10-CM | POA: Diagnosis not present

## 2015-08-30 DIAGNOSIS — J0120 Acute ethmoidal sinusitis, unspecified: Secondary | ICD-10-CM | POA: Diagnosis not present

## 2015-08-30 DIAGNOSIS — Z6834 Body mass index (BMI) 34.0-34.9, adult: Secondary | ICD-10-CM | POA: Diagnosis not present

## 2015-08-30 DIAGNOSIS — R05 Cough: Secondary | ICD-10-CM | POA: Diagnosis not present

## 2015-09-19 DIAGNOSIS — K120 Recurrent oral aphthae: Secondary | ICD-10-CM | POA: Diagnosis not present

## 2015-09-19 DIAGNOSIS — Z6834 Body mass index (BMI) 34.0-34.9, adult: Secondary | ICD-10-CM | POA: Diagnosis not present

## 2015-11-28 DIAGNOSIS — R202 Paresthesia of skin: Secondary | ICD-10-CM | POA: Diagnosis not present

## 2015-11-28 DIAGNOSIS — J0101 Acute recurrent maxillary sinusitis: Secondary | ICD-10-CM | POA: Diagnosis not present

## 2015-11-28 DIAGNOSIS — Z6835 Body mass index (BMI) 35.0-35.9, adult: Secondary | ICD-10-CM | POA: Diagnosis not present

## 2015-12-18 ENCOUNTER — Encounter: Payer: Self-pay | Admitting: Neurology

## 2015-12-18 ENCOUNTER — Ambulatory Visit (INDEPENDENT_AMBULATORY_CARE_PROVIDER_SITE_OTHER): Payer: Federal, State, Local not specified - PPO | Admitting: Neurology

## 2015-12-18 ENCOUNTER — Other Ambulatory Visit: Payer: Self-pay

## 2015-12-18 VITALS — BP 123/74 | HR 76 | Ht 64.0 in | Wt 188.4 lb

## 2015-12-18 DIAGNOSIS — M5413 Radiculopathy, cervicothoracic region: Secondary | ICD-10-CM

## 2015-12-18 DIAGNOSIS — R202 Paresthesia of skin: Secondary | ICD-10-CM

## 2015-12-18 MED ORDER — TOPIRAMATE 25 MG PO TABS
25.0000 mg | ORAL_TABLET | Freq: Two times a day (BID) | ORAL | 3 refills | Status: DC
Start: 2015-12-18 — End: 2016-01-19
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
## 2015-12-21 LAB — LYME DISEASE, WESTERN BLOT
IGG P28 AB.: ABSENT
IGG P45 AB.: ABSENT
IgG P18 Ab.: ABSENT
IgG P23 Ab.: ABSENT
IgG P30 Ab.: ABSENT
IgG P39 Ab.: ABSENT
IgG P41 Ab.: ABSENT
IgG P58 Ab.: ABSENT
IgG P66 Ab.: ABSENT
IgG P93 Ab.: ABSENT
IgM P39 Ab.: ABSENT
IgM P41 Ab.: ABSENT
LYME IGG WB: NEGATIVE
LYME IGM WB: NEGATIVE

## 2015-12-25 ENCOUNTER — Telehealth: Payer: Self-pay

## 2015-12-25 NOTE — Telephone Encounter (Signed)
-----   Message from Micki RileyPramod S Sethi, MD sent at 12/22/2015 12:27 PM EDT ----- Dana RoachKindly inform the patient that blood work for Lyme disease was negative

## 2015-12-25 NOTE — Telephone Encounter (Signed)
Rn call patient about her lyme disease was negative. Pt verbalized understanding. Pt stated the topamax she was prescribed by Dr. Pearlean BrownieSethi is making her tired at work all day long. 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 Pt stated she cant take it.Rn stated a message will be sent to Dr.Sethi. Pt verbalized understanding.

## 2015-12-26 DIAGNOSIS — R05 Cough: Secondary | ICD-10-CM | POA: Diagnosis not present

## 2015-12-26 DIAGNOSIS — Z6835 Body mass index (BMI) 35.0-35.9, adult: Secondary | ICD-10-CM | POA: Diagnosis not present

## 2015-12-26 NOTE — Telephone Encounter (Signed)
Spoke to patient advised her to reduce Topamax to 25 mg at night only x 1 week and then may increase as tolerated.

## 2016-01-02 ENCOUNTER — Ambulatory Visit (HOSPITAL_COMMUNITY): Payer: Federal, State, Local not specified - PPO

## 2016-01-18 NOTE — Telephone Encounter (Addendum)
Pt called to advise she would like to start gabapentin. She has not take topamax in 3-4 days. She is wanting to know if the gabapentin will have the same side effects as topamax She also wants to talk about the MRI that has been ordered (she has not had it yet) She can be reached at [phone]

## 2016-01-18 NOTE — Telephone Encounter (Signed)
RN call patient about changing from topamax to gabapentin. PT stated she took the 25mg  at night of topamax but it still made her feel tired. Pt stated she wanted to try the gabapentin if possible. Rn stated Dr.Sethi reviewed the phone note.Rn stated per Dr. Pearlean BrownieSethi if he prescribes gabapentin there is no guaranteed she want have the same side effects. 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 Pt also stated she had to cancel the mri test for cervical and thoraric spine. Pt stated she had a 700.00 deducible and cannot afford it now. Pt stated its getting closer to christmas and she cannot afford it now. Rn stated a message will be sent to Dr. Pearlean BrownieSethi.pt would like to take a half of neurontin dosage if possible.

## 2016-01-19 ENCOUNTER — Other Ambulatory Visit: Payer: Self-pay | Admitting: Neurology

## 2016-01-19 DIAGNOSIS — R202 Paresthesia of skin: Secondary | ICD-10-CM

## 2016-01-19 MED ORDER — GABAPENTIN 100 MG PO CAPS
300.0000 mg | ORAL_CAPSULE | Freq: Three times a day (TID) | ORAL | 1 refills | Status: DC
Start: 2016-01-19 — End: 2016-04-29

## 2016-01-19 NOTE — Telephone Encounter (Signed)
Dr.Sethi pt wants you know she cant get the two MRI scans done. She cannot afford the copayment right now. PT will get scan next year. JUSt FYI.

## 2016-01-19 NOTE — Telephone Encounter (Signed)
Rn call patient back about discontinuing topamax. PT could not tolerate the topamax. RN stated she was change to gabapentin. Pt stated she could not afford the 700 copayment total for both test. PT will get it the first of the year. Rn stated she is to take 1 capsule twice a day for one week. Rn recommend pt give the medication and to call back if she is having any issues. Pt verbalized understanding, and to keep her appts.

## 2016-01-19 NOTE — Telephone Encounter (Signed)
Ok to change topamax to gabapentin 100 mg twice daily x 1 week then three times daily

## 2016-01-20 NOTE — Telephone Encounter (Signed)
Ok thanks 

## 2016-01-24 DIAGNOSIS — Z6836 Body mass index (BMI) 36.0-36.9, adult: Secondary | ICD-10-CM | POA: Diagnosis not present

## 2016-01-24 DIAGNOSIS — R1011 Right upper quadrant pain: Secondary | ICD-10-CM | POA: Diagnosis not present

## 2016-01-24 DIAGNOSIS — K8020 Calculus of gallbladder without cholecystitis without obstruction: Secondary | ICD-10-CM | POA: Diagnosis not present

## 2016-01-24 DIAGNOSIS — R112 Nausea with vomiting, unspecified: Secondary | ICD-10-CM | POA: Diagnosis not present

## 2016-01-25 ENCOUNTER — Encounter (HOSPITAL_COMMUNITY): Payer: Self-pay

## 2016-01-25 ENCOUNTER — Inpatient Hospital Stay (HOSPITAL_COMMUNITY)
Admission: EM | Admit: 2016-01-25 | Discharge: 2016-01-30 | DRG: 417 | Disposition: A | Discharge status: Home / Self Care | Payer: Federal, State, Local not specified - PPO | Attending: Internal Medicine | Admitting: Internal Medicine

## 2016-01-25 ENCOUNTER — Inpatient Hospital Stay (HOSPITAL_COMMUNITY): Payer: Federal, State, Local not specified - PPO

## 2016-01-25 DIAGNOSIS — Z79899 Other long term (current) drug therapy: Secondary | ICD-10-CM | POA: Diagnosis not present

## 2016-01-25 DIAGNOSIS — K831 Obstruction of bile duct: Secondary | ICD-10-CM

## 2016-01-25 DIAGNOSIS — K8063 Calculus of gallbladder and bile duct with acute cholecystitis with obstruction: Secondary | ICD-10-CM | POA: Diagnosis not present

## 2016-01-25 DIAGNOSIS — Z791 Long term (current) use of non-steroidal anti-inflammatories (NSAID): Secondary | ICD-10-CM | POA: Diagnosis not present

## 2016-01-25 DIAGNOSIS — K8010 Calculus of gallbladder with chronic cholecystitis without obstruction: Secondary | ICD-10-CM | POA: Diagnosis not present

## 2016-01-25 DIAGNOSIS — R945 Abnormal results of liver function studies: Secondary | ICD-10-CM | POA: Diagnosis present

## 2016-01-25 DIAGNOSIS — K219 Gastro-esophageal reflux disease without esophagitis: Secondary | ICD-10-CM | POA: Diagnosis not present

## 2016-01-25 DIAGNOSIS — K8012 Calculus of gallbladder with acute and chronic cholecystitis without obstruction: Secondary | ICD-10-CM | POA: Diagnosis not present

## 2016-01-25 DIAGNOSIS — G4700 Insomnia, unspecified: Secondary | ICD-10-CM | POA: Diagnosis present

## 2016-01-25 DIAGNOSIS — K819 Cholecystitis, unspecified: Secondary | ICD-10-CM

## 2016-01-25 DIAGNOSIS — K8020 Calculus of gallbladder without cholecystitis without obstruction: Secondary | ICD-10-CM | POA: Diagnosis present

## 2016-01-25 DIAGNOSIS — Z01818 Encounter for other preprocedural examination: Secondary | ICD-10-CM | POA: Diagnosis not present

## 2016-01-25 DIAGNOSIS — R1011 Right upper quadrant pain: Secondary | ICD-10-CM

## 2016-01-25 DIAGNOSIS — K8021 Calculus of gallbladder without cholecystitis with obstruction: Secondary | ICD-10-CM

## 2016-01-25 DIAGNOSIS — Z7951 Long term (current) use of inhaled steroids: Secondary | ICD-10-CM | POA: Diagnosis not present

## 2016-01-25 DIAGNOSIS — Z01811 Encounter for preprocedural respiratory examination: Secondary | ICD-10-CM

## 2016-01-25 DIAGNOSIS — K838 Other specified diseases of biliary tract: Secondary | ICD-10-CM

## 2016-01-25 DIAGNOSIS — K8050 Calculus of bile duct without cholangitis or cholecystitis without obstruction: Secondary | ICD-10-CM | POA: Diagnosis not present

## 2016-01-25 DIAGNOSIS — R7989 Other specified abnormal findings of blood chemistry: Secondary | ICD-10-CM

## 2016-01-25 DIAGNOSIS — G9340 Encephalopathy, unspecified: Secondary | ICD-10-CM | POA: Diagnosis not present

## 2016-01-25 DIAGNOSIS — R935 Abnormal findings on diagnostic imaging of other abdominal regions, including retroperitoneum: Secondary | ICD-10-CM | POA: Diagnosis not present

## 2016-01-25 DIAGNOSIS — R109 Unspecified abdominal pain: Secondary | ICD-10-CM | POA: Diagnosis present

## 2016-01-25 LAB — COMPREHENSIVE METABOLIC PANEL
ALT: 413 U/L — ABNORMAL HIGH (ref 14–54)
ANION GAP: 8 (ref 5–15)
AST: 332 U/L — ABNORMAL HIGH (ref 15–41)
Albumin: 4.1 g/dL (ref 3.5–5.0)
Alkaline Phosphatase: 190 U/L — ABNORMAL HIGH (ref 38–126)
BILIRUBIN TOTAL: 5.5 mg/dL — AB (ref 0.3–1.2)
BUN: 12 mg/dL (ref 6–20)
CO2: 26 mmol/L (ref 22–32)
Calcium: 9.7 mg/dL (ref 8.9–10.3)
Chloride: 104 mmol/L (ref 101–111)
Creatinine, Ser: 0.77 mg/dL (ref 0.44–1.00)
Glucose, Bld: 129 mg/dL — ABNORMAL HIGH (ref 65–99)
POTASSIUM: 3.7 mmol/L (ref 3.5–5.1)
Sodium: 138 mmol/L (ref 135–145)
TOTAL PROTEIN: 7.4 g/dL (ref 6.5–8.1)

## 2016-01-25 LAB — PROTIME-INR
INR: 0.9
Prothrombin Time: 12.1 seconds (ref 11.4–15.2)

## 2016-01-25 LAB — URINALYSIS, ROUTINE W REFLEX MICROSCOPIC
Glucose, UA: 100 mg/dL — AB
Hgb urine dipstick: NEGATIVE
Ketones, ur: NEGATIVE mg/dL
Leukocytes, UA: NEGATIVE
NITRITE: NEGATIVE
PH: 6 (ref 5.0–8.0)
Protein, ur: NEGATIVE mg/dL
SPECIFIC GRAVITY, URINE: 1.02 (ref 1.005–1.030)

## 2016-01-25 LAB — LIPASE, BLOOD: Lipase: 44 U/L (ref 11–51)

## 2016-01-25 LAB — CBC
HEMATOCRIT: 40.5 % (ref 36.0–46.0)
HEMOGLOBIN: 13.6 g/dL (ref 12.0–15.0)
MCH: 31.6 pg (ref 26.0–34.0)
MCHC: 33.6 g/dL (ref 30.0–36.0)
MCV: 94 fL (ref 78.0–100.0)
Platelets: 202 10*3/uL (ref 150–400)
RBC: 4.31 MIL/uL (ref 3.87–5.11)
RDW: 12.7 % (ref 11.5–15.5)
WBC: 8.6 10*3/uL (ref 4.0–10.5)

## 2016-01-25 LAB — SURGICAL PCR SCREEN
MRSA, PCR: NEGATIVE
Staphylococcus aureus: POSITIVE — AB

## 2016-01-25 MED ORDER — ACETAMINOPHEN 325 MG PO TABS
650.0000 mg | ORAL_TABLET | Freq: Four times a day (QID) | ORAL | Status: DC | PRN
  Administered 2016-01-25 – 2016-01-29 (×9): 650 mg via ORAL
  Filled 2016-01-25 (×9): qty 2

## 2016-01-25 MED ORDER — ONDANSETRON HCL 4 MG PO TABS: 4.0000 mg | ORAL_TABLET | Freq: Four times a day (QID) | ORAL | Status: DC | PRN

## 2016-01-25 MED ORDER — FAMOTIDINE IN NACL 20-0.9 MG/50ML-% IV SOLN
20.0000 mg | Freq: Two times a day (BID) | INTRAVENOUS | Status: DC
  Administered 2016-01-25 (×2): 20 mg via INTRAVENOUS
  Filled 2016-01-25 (×4): qty 50

## 2016-01-25 MED ORDER — CHLORHEXIDINE GLUCONATE CLOTH 2 % EX PADS
6.0000 | MEDICATED_PAD | Freq: Once | CUTANEOUS | Status: DC
  Administered 2016-01-26: 6 via TOPICAL

## 2016-01-25 MED ORDER — DEXTROSE 5 % IV SOLN
2.0000 g | INTRAVENOUS | Status: DC
  Administered 2016-01-26 – 2016-01-30 (×5): 2 g via INTRAVENOUS
  Filled 2016-01-25 (×6): qty 2

## 2016-01-25 MED ORDER — GADOBENATE DIMEGLUMINE 529 MG/ML IV SOLN
15.0000 mL | Freq: Once | INTRAVENOUS | Status: AC | PRN
  Administered 2016-01-25: 15 mL via INTRAVENOUS

## 2016-01-25 MED ORDER — HYDROMORPHONE HCL 1 MG/ML IJ SOLN
1.0000 mg | INTRAMUSCULAR | Status: DC | PRN
  Administered 2016-01-25 – 2016-01-26 (×4): 1 mg via INTRAVENOUS
  Filled 2016-01-25 (×5): qty 1

## 2016-01-25 MED ORDER — FENTANYL CITRATE (PF) 100 MCG/2ML IJ SOLN: 25.0000 ug | INTRAMUSCULAR | Status: DC | PRN

## 2016-01-25 MED ORDER — LORATADINE 10 MG PO TABS
10.0000 mg | ORAL_TABLET | Freq: Every day | ORAL | Status: DC
  Administered 2016-01-25: 10 mg via ORAL
  Filled 2016-01-25 (×2): qty 1

## 2016-01-25 MED ORDER — HYDROMORPHONE HCL 1 MG/ML IJ SOLN
1.0000 mg | Freq: Once | INTRAMUSCULAR | Status: AC
  Administered 2016-01-25: 1 mg via INTRAVENOUS
  Filled 2016-01-25: qty 1

## 2016-01-25 MED ORDER — ACETAMINOPHEN 650 MG RE SUPP
650.0000 mg | Freq: Four times a day (QID) | RECTAL | Status: DC | PRN
  Filled 2016-01-25: qty 1

## 2016-01-25 MED ORDER — MOMETASONE FURO-FORMOTEROL FUM 200-5 MCG/ACT IN AERO
1.0000 | INHALATION_SPRAY | Freq: Two times a day (BID) | RESPIRATORY_TRACT | Status: DC
  Administered 2016-01-26 – 2016-01-29 (×2): 1 via RESPIRATORY_TRACT
  Filled 2016-01-25: qty 8.8

## 2016-01-25 MED ORDER — GABAPENTIN 300 MG PO CAPS
300.0000 mg | ORAL_CAPSULE | Freq: Three times a day (TID) | ORAL | Status: DC
  Administered 2016-01-25 – 2016-01-26 (×3): 300 mg via ORAL
  Filled 2016-01-25 (×5): qty 1

## 2016-01-25 MED ORDER — ALBUTEROL SULFATE (2.5 MG/3ML) 0.083% IN NEBU: 3.0000 mL | INHALATION_SOLUTION | RESPIRATORY_TRACT | Status: DC | PRN

## 2016-01-25 MED ORDER — ONDANSETRON HCL 4 MG/2ML IJ SOLN
4.0000 mg | Freq: Four times a day (QID) | INTRAMUSCULAR | Status: DC | PRN
  Administered 2016-01-26 – 2016-01-29 (×2): 4 mg via INTRAVENOUS
  Filled 2016-01-25: qty 2

## 2016-01-25 MED ORDER — FENTANYL CITRATE (PF) 100 MCG/2ML IJ SOLN
50.0000 ug | INTRAMUSCULAR | Status: DC | PRN
  Administered 2016-01-25: 50 ug via INTRAVENOUS
  Filled 2016-01-25: qty 2

## 2016-01-25 MED ORDER — SODIUM CHLORIDE 0.9 % IV SOLN
INTRAVENOUS | Status: AC
  Administered 2016-01-25: 08:00:00 via INTRAVENOUS

## 2016-01-25 MED ORDER — CHLORHEXIDINE GLUCONATE CLOTH 2 % EX PADS
6.0000 | MEDICATED_PAD | Freq: Once | CUTANEOUS | Status: DC
  Administered 2016-01-25: 6 via TOPICAL

## 2016-01-25 MED ORDER — ONDANSETRON HCL 4 MG/2ML IJ SOLN
4.0000 mg | Freq: Once | INTRAMUSCULAR | Status: AC | PRN
  Administered 2016-01-25: 4 mg via INTRAVENOUS
  Filled 2016-01-25: qty 2

## 2016-01-25 MED ORDER — HEPARIN SODIUM (PORCINE) 5000 UNIT/ML IJ SOLN
5000.0000 [IU] | Freq: Three times a day (TID) | INTRAMUSCULAR | Status: DC
  Administered 2016-01-25 – 2016-01-30 (×7): 5000 [IU] via SUBCUTANEOUS
  Filled 2016-01-25 (×10): qty 1

## 2016-01-25 MED ORDER — LORAZEPAM 2 MG/ML IJ SOLN: 0.5000 mg | Freq: Four times a day (QID) | INTRAMUSCULAR | Status: DC | PRN

## 2016-01-25 MED ORDER — DEXTROSE 5 % IV SOLN
2.0000 g | Freq: Once | INTRAVENOUS | Status: AC
  Administered 2016-01-25: 2 g via INTRAVENOUS
  Filled 2016-01-25: qty 2

## 2016-01-25 MED ORDER — ZOLPIDEM TARTRATE 5 MG PO TABS
2.5000 mg | ORAL_TABLET | Freq: Every day | ORAL | Status: DC
  Administered 2016-01-25: 2.5 mg via ORAL
  Filled 2016-01-25 (×3): qty 1

## 2016-01-25 NOTE — Consult Note (Signed)
Referring Provider: Erick BlinksJehanzeb Memon, Dana Clarke Primary Care Physician:  Dana NewcomerBOYD, Dana S, PA Primary Gastroenterologist:  Dana SessionsMichael Rourk, Dana Clarke  Reason for Consultation:  Obstructive jaundice  HPI: Dana Clarke is a 55 y.o. female admitted acute onset RUQ pain, vomiting. She has had intermittent symptoms off/on for more than 8 years. She has had gallbladder work up on several occasions and negative. [facility], with normal gallbladder and no stones although CBD mildly prominent at 7mm. She had been find since 03/2015 until Sunday. She developed acute onset ruq pain. Couple of episodes of vomiting. Felt fine Monday and Tuesday but Wednesday woke up with excruciating ruq pain radiating into back. Went to see PCP. Had bloodwork done, we do not have those results. Abdominal u/Clarke done as well that shows multiple gallstones up to 7mm, CBD 6.149mm, probable nonobstructing renal stone in right kidney.  She woke up this morning at 4am, with chills/shaking all over. RUQ pain. Decided to come to ER.    Some intermittent heartburn, vinegar took it away. Was doing well so stopped the vinegar and heartburn back last couple of weeks. BM regular. No melena, brbpr.   No tatoos. No blood transfusions. No h/o illicit drug use. No ill contacts.   Only regular medications right now are Aleve PM at bedtime.    Prior to Admission medications   Medication Sig Start Date End Date Taking? Authorizing Provider  albuterol (PROVENTIL HFA;VENTOLIN HFA) 108 (90 Base) MCG/ACT inhaler Inhale into the lungs every 6 (six) hours as needed for wheezing or shortness of breath.    Historical Provider, Dana Clarke  benzonatate (TESSALON) 100 MG capsule Take 100 mg by mouth daily as needed. 04/02/13   Historical Provider, Dana Clarke  cetirizine (ZYRTEC) 10 MG chewable tablet Chew 10 mg by mouth at bedtime. allergies    Historical Provider, Dana Clarke  Fluticasone-Salmeterol (ADVAIR) 250-50 MCG/DOSE AEPB Inhale 1 puff into the lungs 2 (two) times daily.     Historical Provider, Dana Clarke  gabapentin (NEURONTIN) 100 MG capsule Take 3 capsules (300 mg total) by mouth 3 (three) times daily. Start 1 capsule twice daily x 1 week then three times daily 01/19/16   Micki RileyPramod Clarke Sethi, Dana Clarke  loratadine (CLARITIN) 10 MG tablet Take 10 mg by mouth at bedtime.     Historical Provider, Dana Clarke  naproxen sodium (ANAPROX) 220 MG tablet Take 220 mg by mouth at night as needed.    Historical Provider, Dana Clarke  zolpidem (AMBIEN) 10 MG tablet Take 2.5 mg by mouth at bedtime. 1/4 of a pill    Historical Provider, Dana Clarke    Current Facility-Administered Medications  Medication Dose Route Frequency Provider Last Rate Last Dose  . 0.9 %  sodium chloride infusion   Intravenous Continuous Briscoe Deutscherimothy Clarke Opyd, Dana Clarke 90 mL/hr at 01/25/16 0745    . acetaminophen (TYLENOL) tablet 650 mg  650 mg Oral Q6H PRN Briscoe Deutscherimothy Clarke Opyd, Dana Clarke       Or  . acetaminophen (TYLENOL) suppository 650 mg  650 mg Rectal Q6H PRN Lavone Neriimothy Clarke Opyd, Dana Clarke      . albuterol (PROVENTIL) (2.5 MG/3ML) 0.083% nebulizer solution 3 mL  3 mL Inhalation Q4H PRN Briscoe Deutscherimothy Clarke Opyd, Dana Clarke      . famotidine (PEPCID) IVPB 20 mg premix  20 mg Intravenous Q12H Timothy Clarke Opyd, Dana Clarke      . gabapentin (NEURONTIN) capsule 300 mg  300 mg Oral TID Briscoe Deutscherimothy Clarke Opyd, Dana Clarke      . heparin injection 5,000 Units  5,000 Units  Subcutaneous Q8H Lavone Neriimothy Clarke Opyd, Dana Clarke      . HYDROmorphone (DILAUDID) injection 1 mg  1 mg Intravenous Q4H PRN Dana BlinksJehanzeb Memon, Dana Clarke      . loratadine (CLARITIN) tablet 10 mg  10 mg Oral QHS Timothy Clarke Opyd, Dana Clarke      . LORazepam (ATIVAN) injection 0.5-1 mg  0.5-1 mg Intravenous Q6H PRN Lavone Neriimothy Clarke Opyd, Dana Clarke      . mometasone-formoterol (DULERA) 200-5 MCG/ACT inhaler 1 puff  1 puff Inhalation BID Lavone Neriimothy Clarke Opyd, Dana Clarke      . ondansetron (ZOFRAN) tablet 4 mg  4 mg Oral Q6H PRN Briscoe Deutscherimothy Clarke Opyd, Dana Clarke       Or  . ondansetron (ZOFRAN) injection 4 mg  4 mg Intravenous Q6H PRN Briscoe Deutscherimothy Clarke Opyd, Dana Clarke      . zolpidem (AMBIEN) tablet 2.5 mg  2.5 mg Oral QHS Briscoe Deutscherimothy Clarke Opyd, Dana Clarke         Allergies as of 01/25/2016 - Review Complete 01/25/2016  Allergen Reaction Noted  . Codeine Itching 08/20/2011    Past Medical History:  Diagnosis Date  . Abdominal pain    chronic right sided abd pain, gallbladder w/u negative in 2009 (u/Clarke and HIDA), repeat u/Clarke negative in 2014, 03/2015  . Bell'Clarke palsy   . Diverticula of colon   . GERD (gastroesophageal reflux disease)   . Rectocele   . Seasonal allergies   . Stress     Past Surgical History:  Procedure Laterality Date  . COLONOSCOPY  01/2008   XBJ:YNWGNFARMR:friable anal canal, pancolonic diverticula, normal terminal ileium  . ESOPHAGOGASTRODUODENOSCOPY  01/11/2008    OZH:YQMVHQRMR:Normal esophagus, small hiatal hernia, tiny antral erosions.  Otherwise unremarkable gastric mucosa, patent pylorus, normal D1 and D2.   Marland Kitchen. SIGMOIDOSCOPY  02/12/96   interenal hemorrhoids  . TONSILLECTOMY      Family History  Problem Relation Age of Onset  . COPD Father     deceased age 55  . Heart disease Mother     deceased age 3870s  . Diabetes Mother   . Cirrhosis Brother     etoh  . Bell'Clarke palsy Brother   . Stroke Brother   . Breast cancer Maternal Aunt     2 maternal great aunt.  . Breast cancer Paternal Aunt   . Colon cancer Neg Hx     Social History   Social History  . Marital status: Divorced    Spouse name: N/A  . Number of children: 3  . Years of education: N/A   Occupational History  .  [employer]   Social History Main Topics  . Smoking status: Never Smoker  . Smokeless tobacco: Never Used  . Alcohol use No  . Drug use: No  . Sexual activity: Not on file   Other Topics Concern  . Not on file   Social History Narrative  . No narrative on file     ROS:  General: Negative for anorexia, weight loss, fever. See hpi. Eyes: Negative for vision changes.  ENT: Negative for hoarseness, difficulty swallowing , nasal congestion. CV: Negative for chest pain, angina, palpitations, dyspnea on exertion, peripheral edema.   Respiratory: Negative for dyspnea at rest, dyspnea on exertion, cough, sputum, wheezing.  GI: See history of present illness. GU:  Negative for dysuria, hematuria, urinary incontinence, urinary frequency, nocturnal urination.  MS: Negative for joint pain, low back pain.  Derm: Negative for rash or itching.  Neuro: Negative for weakness, abnormal sensation, seizure, frequent headaches, memory loss,  confusion.  Psych: Negative for anxiety, depression, suicidal ideation, hallucinations.  Endo: Negative for unusual weight change.  Heme: Negative for bruising or bleeding. Allergy: Negative for rash or hives.       Physical Examination: Vital signs in last 24 hours: Temp:  [98.4 F (36.9 C)] 98.4 F (36.9 C) (11/16 0450) Pulse Rate:  [73-87] 73 (11/16 0500) Resp:  [20] 20 (11/16 0450) BP: (112-138)/(70-81) 112/70 (11/16 0500) SpO2:  [97 %-98 %] 98 % (11/16 0500) Weight:  [185 lb (83.9 kg)] 185 lb (83.9 kg) (11/16 0450)    General: Well-nourished, well-developed in no acute distress. Appears uncomfortable.  Head: Normocephalic, atraumatic.   Eyes: Conjunctiva pink, mild icterus. Mouth: Oropharyngeal mucosa moist and pink , no lesions erythema or exudate. Neck: Supple without thyromegaly, masses, or lymphadenopathy.  Lungs: Clear to auscultation bilaterally.  Heart: Regular rate and rhythm, no murmurs rubs or gallops.  Abdomen: Bowel sounds are normal, moderate RUQ tenderness, nondistended, no hepatosplenomegaly or masses, no abdominal bruits or    hernia , no rebound or guarding.   Rectal: not performed Extremities: No lower extremity edema, clubbing, deformity.  Neuro: Alert and oriented x 4 , grossly normal neurologically.  Skin: Warm and dry, no rash or jaundice.   Psych: Alert and cooperative, normal mood and affect.        Intake/Output from previous day: 11/15 0701 - 11/16 0700 In: 50 [IV Piggyback:50] Out: -  Intake/Output this shift: No intake/output data  recorded.  Lab Results: CBC  Recent Labs  01/25/16 0455  WBC 8.6  HGB 13.6  HCT 40.5  MCV 94.0  PLT 202   BMET  Recent Labs  01/25/16 0455  NA 138  K 3.7  CL 104  CO2 26  GLUCOSE 129*  BUN 12  CREATININE 0.77  CALCIUM 9.7   LFT  Recent Labs  01/25/16 0455  BILITOT 5.5*  ALKPHOS 190*  AST 332*  ALT 413*  PROT 7.4  ALBUMIN 4.1    Lipase  Recent Labs  01/25/16 0455  LIPASE 44    PT/INR  Recent Labs  01/25/16 0455  LABPROT 12.1  INR 0.90      Imaging Studies: No results found.Pierre.Alas week]   Impression: 55 y/o female with chronic intermittent RUQ pain over the years with negative gb work up in the past who presents with recurrent acute onset RUQ pain and vomiting. Now abd u/Clarke showing cholelithiasis with CBD 6.45mm, abnormal LFTs. Suspicious for biliary obstruction from passed CBD stone. U/Clarke without evidence of CBD stone. Likely will require MRCP as next step.   Plan: 1. Will obtain yesterday'Clarke labs from PCP. 2. Consider MRCP today. Patient agreeable.  3. NPO except limited ice chips. Discussed with nursing staff.    We would like to thank you for the opportunity to participate in the care of Dana Clarke.  Leanna Battles. [facility] [phone] 11/16/20178:40 AM     LOS: 0 days

## 2016-01-25 NOTE — Progress Notes (Signed)
Patient admitted to the hospital earlier this morning by Dr. Antionette Charpyd.  Patient seen and examined. Reports abdominal pain is better with pain medications. She has a headache at this time. No nausea or vomiting. Abdomen is soft, tender in the right upper quadrant, lungs are otherwise clear.  She is admitted with elevated liver function tests with concerns of cholecystitis. MRCP did not show evidence of choledocholithiasis. It is likely that she has recently passed a stone. Anticipate that her LFTs continue to improve by tomorrow morning. GI and general surgery following. Will likely need cholecystectomy. Start on clear liquids for now and keep nothing by mouth after midnight.  Dana Clarke

## 2016-01-25 NOTE — ED Triage Notes (Signed)
Pt had an ultrasound yesterday for gallbladder, states she did not get the results yet, started hurting worse over night.

## 2016-01-25 NOTE — Progress Notes (Signed)
Pharmacy Antibiotic Note  Dana Clarke is a 55 y.o. female admitted on 01/25/2016 with RUQ abdominal pain.  Pharmacy has been consulted for Rocephin dosing.  Plan: Rocephin 2gm IV every 24 hours. Monitor labs, micro and vitals.  Dose stable, sign off.  Height: 5\' 4"  (162.6 cm) Weight: 185 lb (83.9 kg) IBW/kg (Calculated) : 54.7  Temp (24hrs), Avg:98.4 F (36.9 C), Min:98.4 F (36.9 C), Max:98.4 F (36.9 C)   Recent Labs Lab 01/25/16 0455  WBC 8.6  CREATININE 0.77    Estimated Creatinine Clearance: 83.3 mL/min (by C-G formula based on SCr of 0.77 mg/dL).    Allergies  Allergen Reactions  . Codeine Itching   Antimicrobials this admission: Rocephin 11/16 >>   Dose adjustments this admission: n/a   Microbiology results:  Thank you for allowing pharmacy to be a part of this patient's care.  Dana Clarke, Dana Clarke 01/25/2016 11:44 AM

## 2016-01-25 NOTE — H&P (Signed)
History and Physical    Dana LoweBrenda T Clarke ZOX:[id number] DOB: 08/09/1960 DOA: 01/25/2016  PCP: Lovey NewcomerBOYD, WILLIAM S, PA   Patient coming from: Home  Chief Complaint: RUQ abdominal pain  HPI: Dana Clarke is a 55 y.o. female with medical history significant for GERD and chronic intermittent right upper quadrant abdominal pain who presents to the emergency department with acute development of severe right upper quadrant abdominal pain with nausea. Patient reports occasional pain in the right upper quadrant of her abdomen going back years and describes numerous evaluations with imaging which she has been told is negative. Her pain returned over the past several days, coming and going, associated with mild nausea, and described as moderate in intensity, sharp in character, worse with food or quick movement, and with no alleviating factors identified. Pain became much more severe yesterday and she was [place] with abdominal ultrasound demonstrates cholelithiasis without acute cholecystitis, and dilation in the common bile duct that is reportedly less than what was seen on a prior ultrasound. Patient denies any recent fevers or chills and denies any use of alcohol or illicit substances. There has been no change in her medications and no new medications recently. She has remote tonsillectomy, but no other surgical history. She denies any vomiting or diarrhea and denies melena or hematochezia.  ED Course: Upon arrival to the ED, patient is found to be afebrile, saturating well on room air, and with vital signs stable. She panels notable for alkaline phosphatase 190, AST 332, ALT 413, and total bilirubin of 5.5. Lipase is within the normal limits and CBC is unremarkable. Patient was treated with 2 g of Rocephin empirically and provided symptomatic care with fentanyl, Dilaudid, and Zofran. Gen. surgery was consulted by the ED physician and advised a gastroenterology  consultation for possible ERCP or MRCP. Gastroenterology was consulted and advised a medical admission. The patient has remained hemodynamically stable in the ED and in no apparent respiratory distress. She'll be admitted to the medical-surgical unit for ongoing evaluation and management of acute worsening in her chronic intermittent right upper quadrant abdominal pain with hyperbilirubinemia and elevated transaminases concerning for possible biliary obstruction.  Review of Systems:  All other systems reviewed and apart from HPI, are negative.  Past Medical History:  Diagnosis Date  . Abdominal pain    chronic right sided abd pain, gallbladder w/u negative in 2009 (u/s and HIDA)  . Bell's palsy   . Diverticula of colon   . GERD (gastroesophageal reflux disease)   . Rectocele   . Seasonal allergies   . Stress     Past Surgical History:  Procedure Laterality Date  . COLONOSCOPY  01/2008   WJX:BJYNWGNRMR:friable anal canal, pancolonic diverticula, normal terminal ileium  . ESOPHAGOGASTRODUODENOSCOPY  01/11/2008    FAO:ZHYQMVRMR:Normal esophagus, small hiatal hernia, tiny antral erosions.  Otherwise unremarkable gastric mucosa, patent pylorus, normal D1 and D2.   Marland Kitchen. SIGMOIDOSCOPY  02/12/96   interenal hemorrhoids  . TONSILLECTOMY       reports that she has never smoked. She has never used smokeless tobacco. She reports that she does not drink alcohol or use drugs.  Allergies  Allergen Reactions  . Codeine Itching    Family History  Problem Relation Age of Onset  . COPD Father     deceased age 55  . Heart disease Mother     deceased age 670s  . Diabetes Mother   . Cirrhosis Brother     etoh  .  Bell's palsy Brother   . Stroke Brother   . Breast cancer Maternal Aunt     2 maternal great aunt.  . Breast cancer Paternal Aunt   . Colon cancer Neg Hx      Prior to Admission medications   Medication Sig Start Date End Date Taking? Authorizing Provider  albuterol (PROVENTIL HFA;VENTOLIN HFA) 108  (90 Base) MCG/ACT inhaler Inhale into the lungs every 6 (six) hours as needed for wheezing or shortness of breath.    Historical Provider, MD  benzonatate (TESSALON) 100 MG capsule Take 100 mg by mouth daily as needed. 04/02/13   Historical Provider, MD  cetirizine (ZYRTEC) 10 MG chewable tablet Chew 10 mg by mouth at bedtime. allergies    Historical Provider, MD  Fluticasone-Salmeterol (ADVAIR) 250-50 MCG/DOSE AEPB Inhale 1 puff into the lungs 2 (two) times daily.    Historical Provider, MD  gabapentin (NEURONTIN) 100 MG capsule Take 3 capsules (300 mg total) by mouth 3 (three) times daily. Start 1 capsule twice daily x 1 week then three times daily 01/19/16   Micki Riley, MD  loratadine (CLARITIN) 10 MG tablet Take 10 mg by mouth at bedtime.     Historical Provider, MD  naproxen sodium (ANAPROX) 220 MG tablet Take 220 mg by mouth 2 (two) times daily with a meal.    Historical Provider, MD  zolpidem (AMBIEN) 10 MG tablet Take 2.5 mg by mouth at bedtime. 1/4 of a pill    Historical Provider, MD    Physical Exam: Vitals:   01/25/16 0450 01/25/16 0500  BP: 138/81 112/70  Pulse: 87 73  Resp: 20   Temp: 98.4 F (36.9 C)   TempSrc: Oral   SpO2: 97% 98%  Weight: 83.9 kg (185 lb)   Height: 5\' 4"  (1.626 m)       Constitutional: No respiratory distress, in apparent pain, clutching abdomen Eyes: PERTLA, lids and conjunctivae normal ENMT: Mucous membranes are moist. Posterior pharynx clear of any exudate or lesions.   Neck: normal, supple, no masses, no thyromegaly Respiratory: clear to auscultation bilaterally, no wheezing, no crackles. Normal respiratory effort.    Cardiovascular: S1 & S2 heard, regular rate and rhythm, hyperdynamic precordium. No extremity edema. 2+ pedal pulses. No significant JVD. Abdomen: No distension, tender in epigastrium and RUQ, no rebound pain or guarding, no masses palpated. Bowel sounds active.  Musculoskeletal: no clubbing / cyanosis. No joint deformity upper  and lower extremities. Normal muscle tone.  Skin: no significant rashes, lesions, ulcers. Warm, dry, well-perfused. Neurologic: CN 2-12 grossly intact. Sensation intact, DTR normal. Strength 5/5 in all 4 limbs.  Psychiatric: Normal judgment and insight. Alert and oriented x 3. Normal mood and affect.     Labs on Admission: I have personally reviewed following labs and imaging studies  CBC:  Recent Labs Lab 01/25/16 0455  WBC 8.6  HGB 13.6  HCT 40.5  MCV 94.0  PLT 202   Basic Metabolic Panel:  Recent Labs Lab 01/25/16 0455  NA 138  K 3.7  CL 104  CO2 26  GLUCOSE 129*  BUN 12  CREATININE 0.77  CALCIUM 9.7   GFR: Estimated Creatinine Clearance: 83.3 mL/min (by C-G formula based on SCr of 0.77 mg/dL). Liver Function Tests:  Recent Labs Lab 01/25/16 0455  AST 332*  ALT 413*  ALKPHOS 190*  BILITOT 5.5*  PROT 7.4  ALBUMIN 4.1    Recent Labs Lab 01/25/16 0455  LIPASE 44   No results for input(s): AMMONIA in  the last 168 hours. Coagulation Profile: No results for input(s): INR, PROTIME in the last 168 hours. Cardiac Enzymes: No results for input(s): CKTOTAL, CKMB, CKMBINDEX, TROPONINI in the last 168 hours. BNP (last 3 results) No results for input(s): PROBNP in the last 8760 hours. HbA1C: No results for input(s): HGBA1C in the last 72 hours. CBG: No results for input(s): GLUCAP in the last 168 hours. Lipid Profile: No results for input(s): CHOL, HDL, LDLCALC, TRIG, CHOLHDL, LDLDIRECT in the last 72 hours. Thyroid Function Tests: No results for input(s): TSH, T4TOTAL, FREET4, T3FREE, THYROIDAB in the last 72 hours. Anemia Panel: No results for input(s): VITAMINB12, FOLATE, FERRITIN, TIBC, IRON, RETICCTPCT in the last 72 hours. Urine analysis:    Component Value Date/Time   COLORURINE YELLOW 05/06/2013 1128   APPEARANCEUR CLEAR 05/06/2013 1128   LABSPEC 1.010 05/06/2013 1128   PHURINE 6.0 05/06/2013 1128   GLUCOSEU NEGATIVE 05/06/2013 1128   HGBUR  NEGATIVE 05/06/2013 1128   BILIRUBINUR NEGATIVE 05/06/2013 1128   KETONESUR NEGATIVE 05/06/2013 1128   PROTEINUR NEGATIVE 05/06/2013 1128   UROBILINOGEN 0.2 05/06/2013 1128   NITRITE NEGATIVE 05/06/2013 1128   LEUKOCYTESUR TRACE (A) 05/06/2013 1128   Sepsis Labs: @LABRCNTIP (procalcitonin:4,lacticidven:4) )No results found for this or any previous visit (from the past 240 hour(s)).   Radiological Exams on Admission: No results found.  EKG: Not performed, will obtain as appropriate  Assessment/Plan  1. Acute abdominal pain with jaundice, elevated LFTs  - Pt reports several years of colicky RUQ pain, but imaging had been negative for gall stones or other pathology - Pain has worsened significantly over the days leading up to admission and abdominal US from outside hospital from 11/15 reportedly demonstrates cholelithiasis without acute cholecystitis, and CBD dilation which is said to be less than on a prior study  - There is no fever or chills and no leukocytosis; pt has remained hemodynamically stable  - Gen surgery and GI have been consulted by the ED physician; NPO was requested  - Patient will be continued on prophylactic Rocephin, NPO, IVF hydration, prn antiemetics, and prn analgesia    2. GERD  - No EGD report on file  - Will treat with IV Pepcid while NPO    3. Insomnia  - Managed with low-dose Ambien at home; will resume once she is appropriate for a diet     DVT prophylaxis: sq heparin  Code Status: Full  Family Communication: Husband updated at bedside  Disposition Plan: Admit to med-surg Consults called: Gen surgery, GI  Admission status: Inpatient    Briscoe Deutscherimothy S Tamsyn Owusu, MD [facility] Pager [phone]  If 7PM-7AM, please contact night-coverage [email or web address] Password [email or web address]  01/25/2016, 6:16 AM

## 2016-01-25 NOTE — ED Provider Notes (Signed)
[facility]-EMERGENCY DEPT Provider Note   CSN: [id number] Arrival date & time: 01/25/16  0441  Time seen 05:25 AM   History   Chief Complaint Chief Complaint  Patient presents with  . Abdominal Pain    HPI Ali LoweBrenda T Lobue is a 55 y.o. female.  HPI   Pt reports she started having RUQ pain on Nov 12 that lasted about 6 hrs with nausea. It returned again on Nov 15 about 1:30 AM and has been constant but waxes and wanes. She states now it's starting to radiate into her back. She can only describe it as a "hitting" pain. She denies vomiting but has had nausea. She denies fever but states she started having some chills this morning and shaking. She denies dysuria but has had frequency without hematuria. She states any kind of movement makes the pain worse, nothing makes it feel better. She states she had similar pain in January and had ultrasound done at that time that was normal. She denies a history of kidney stones in her family but she does report a lot of caffeine and milk product ingestion.  She reports her mother had gallstones at about her age and her brother had gallstones and developed pancreatitis and died.  PCP PA Leavy CellaBoyd at [facility] [place]    Past Medical History:  Diagnosis Date  . Abdominal pain    chronic right sided abd pain, gallbladder w/u negative in 2009 (u/s and HIDA)  . Bell's palsy   . Diverticula of colon   . GERD (gastroesophageal reflux disease)   . Rectocele   . Seasonal allergies   . Stress     Patient Active Problem List   Diagnosis Date Noted  . Cholelithiasis 01/25/2016  . Hyperbilirubinemia 01/25/2016  . Obstructive jaundice 01/25/2016  . LFT elevation 01/25/2016  . Choledocholithiasis 01/25/2016  . GERD (gastroesophageal reflux disease) 04/13/2012  . Abdominal pain 04/13/2012  . Rectal bleed 08/20/2011  . Rectocele 08/20/2011    Class: Question of  . Constipation 08/20/2011  . Diarrhea 08/20/2011    Past Surgical History:  Procedure  Laterality Date  . COLONOSCOPY  01/2008   WUJ:WJXBJYNRMR:friable anal canal, pancolonic diverticula, normal terminal ileium  . ESOPHAGOGASTRODUODENOSCOPY  01/11/2008    WGN:FAOZHYRMR:Normal esophagus, small hiatal hernia, tiny antral erosions.  Otherwise unremarkable gastric mucosa, patent pylorus, normal D1 and D2.   Marland Kitchen. SIGMOIDOSCOPY  02/12/96   interenal hemorrhoids  . TONSILLECTOMY      OB History    No data available       Home Medications    Prior to Admission medications   Medication Sig Start Date End Date Taking? Authorizing Provider  albuterol (PROVENTIL HFA;VENTOLIN HFA) 108 (90 Base) MCG/ACT inhaler Inhale into the lungs every 6 (six) hours as needed for wheezing or shortness of breath.    Historical Provider, MD  benzonatate (TESSALON) 100 MG capsule Take 100 mg by mouth daily as needed. 04/02/13   Historical Provider, MD  cetirizine (ZYRTEC) 10 MG chewable tablet Chew 10 mg by mouth at bedtime. allergies    Historical Provider, MD  Fluticasone-Salmeterol (ADVAIR) 250-50 MCG/DOSE AEPB Inhale 1 puff into the lungs 2 (two) times daily.    Historical Provider, MD  gabapentin (NEURONTIN) 100 MG capsule Take 3 capsules (300 mg total) by mouth 3 (three) times daily. Start 1 capsule twice daily x 1 week then three times daily 01/19/16   Micki RileyPramod S Sethi, MD  loratadine (CLARITIN) 10 MG tablet Take 10 mg by mouth at bedtime.  Historical Provider, MD  naproxen sodium (ANAPROX) 220 MG tablet Take 220 mg by mouth 2 (two) times daily with a meal.    Historical Provider, MD  zolpidem (AMBIEN) 10 MG tablet Take 2.5 mg by mouth at bedtime. 1/4 of a pill    Historical Provider, MD    Family History Family History  Problem Relation Age of Onset  . COPD Father     deceased age 55  . Heart disease Mother     deceased age 6070s  . Diabetes Mother   . Cirrhosis Brother     etoh  . Bell's palsy Brother   . Stroke Brother   . Breast cancer Maternal Aunt     2 maternal great aunt.  . Breast cancer Paternal  Aunt   . Colon cancer Neg Hx     Social History Social History  Substance Use Topics  . Smoking status: Never Smoker  . Smokeless tobacco: Never Used  . 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  Allergies   Codeine   Review of Systems Review of Systems  All other systems reviewed and are negative.    Physical Exam Updated Vital Signs BP 112/70   Pulse 73   Temp 98.4 F (36.9 C) (Oral)   Resp 20   Ht 5\' 4"  (1.626 m)   Wt 185 lb (83.9 kg)   SpO2 98%   BMI 31.76 kg/m   Vital signs normal    Physical Exam  Constitutional: She is oriented to person, place, and time. She appears well-developed and well-nourished.  Non-toxic appearance. She does not appear ill. She appears distressed.  HENT:  Head: Normocephalic and atraumatic.  Right Ear: External ear normal.  Left Ear: External ear normal.  Nose: Nose normal. No mucosal edema or rhinorrhea.  Mouth/Throat: Oropharynx is clear and moist and mucous membranes are normal. No dental abscesses or uvula swelling.  Eyes: Conjunctivae and EOM are normal. Pupils are equal, round, and reactive to light.  Neck: Normal range of motion and full passive range of motion without pain. Neck supple.  Cardiovascular: Normal rate, regular rhythm and normal heart sounds.  Exam reveals no gallop and no friction rub.   No murmur heard. Pulmonary/Chest: Effort normal and breath sounds normal. No respiratory distress. She has no wheezes. She has no rhonchi. She has no rales. She exhibits no tenderness and no crepitus.  Abdominal: Soft. Normal appearance and bowel sounds are normal. She exhibits no distension. There is tenderness. There is no rebound and no guarding.    Musculoskeletal: Normal range of motion. She exhibits no edema or tenderness.       Back:  Moves all extremities well. Patient has left flank pain to palpation but not the right.    Neurological: She is alert and oriented to person, place, and time. She has normal strength. No cranial nerve deficit.  Skin: Skin is warm, dry and intact. No rash noted. No erythema. No pallor.  Patient has questionable yellow tint to skin but husband states that is her normal color.  Psychiatric: She has a normal mood and affect. Her speech is normal and behavior is normal. Her mood appears not anxious.  Nursing note and vitals reviewed.    ED Treatments / Results  Labs (all labs ordered are listed, but only abnormal results are displayed) Results for orders placed or performed during the hospital encounter of 01/25/16  Lipase, blood  Result Value Ref Range   Lipase 44 11 - 51 U/L  Comprehensive metabolic panel  Result Value Ref Range   Sodium 138 135 - 145 mmol/L   Potassium 3.7 3.5 - 5.1 mmol/L   Chloride 104 101 - 111 mmol/L   CO2 26 22 - 32 mmol/L   Glucose, Bld 129 (H) 65 - 99 mg/dL   BUN 12 6 - 20 mg/dL   Creatinine, Ser 1.61 0.44 - 1.00 mg/dL   Calcium 9.7 8.9 - 09.6 mg/dL   Total Protein 7.4 6.5 - 8.1 g/dL   Albumin 4.1 3.5 - 5.0 g/dL   AST 045 (H) 15 - 41 U/L   ALT 413 (H) 14 - 54 U/L   Alkaline Phosphatase 190 (H) 38 - 126 U/L   Total Bilirubin 5.5 (H) 0.3 - 1.2 mg/dL   GFR calc non Af Amer >60 >60 mL/min   GFR calc Af Amer >60 >60 mL/min   Anion gap 8 5 - 15  CBC  Result Value Ref Range   WBC 8.6 4.0 - 10.5 K/uL   RBC 4.31 3.87 - 5.11 MIL/uL   Hemoglobin 13.6 12.0 - 15.0 g/dL   HCT 40.9 81.1 - 91.4 %   MCV 94.0 78.0 - 100.0 fL   MCH 31.6 26.0 - 34.0 pg   MCHC 33.6 30.0 - 36.0 g/dL   RDW 78.2 95.6 - 21.3 %   Platelets 202 150 - 400 K/uL   Laboratory interpretation all normal except New acute elevation of LFTs including total bilirubin    EKG  EKG Interpretation None       Radiology No results found.  Procedures Procedures (including critical care time)  Medications Ordered in ED Medications  fentaNYL (SUBLIMAZE) injection 50 mcg (50 mcg  Intravenous Given 01/25/16 0509)  cefTRIAXone (ROCEPHIN) 2 g in dextrose 5 % 50 mL IVPB (2 g Intravenous New Bag/Given 01/25/16 0546)  ondansetron (ZOFRAN) injection 4 mg (4 mg Intravenous Given 01/25/16 0501)  HYDROmorphone (DILAUDID) injection 1 mg (1 mg Intravenous Given 01/25/16 0545)     Initial Impression / Assessment and Plan / ED Course  I have reviewed the triage vital signs and the nursing notes.  Pertinent labs & imaging results that were available during my care of the patient were reviewed by me and considered in my medical decision making (see chart for details).  Clinical Course    Patient had already been given fentanyl for pain by nursing staff without improvement of her pain. She was given Dilaudid IV. I was able to access her ultrasound report from [facility]. In January of this year she did not have gallstones but was noted to have mildly prominent, bile duct at 7 mm. Ultrasound of the abdomen done on November 15 shows gallstones without sonographic evidence of acute cholecystitis. Top normal common bile duct diameter which has decreased since the study in January at 6.9 mm. No acute abnormality of the liver or pancreas or spleen. No hydronephrosis. Probable 5 mm nonobstructing stone in the upper pole right kidney. Normal appearance of the IVC and abdominal aorta.  5:45 AM patient discussed with Dr. Earlene Plater, surgeon. He states he will consult on patient however he feels that GI should be involved for possible ERCP or MRCP.  5:57 AM Dr Antionette Char, hospitalist, will admit to med-surg  5:59 AM Dr. Renae Fickle, gastroenterologist asked the patient be kept nothing by mouth.    Final Clinical Impressions(s) / ED Diagnoses   Final diagnoses:  Choledocholithiasis  Plan admission  Devoria Albe, MD, Concha Pyo, MD 01/25/16 [phone]

## 2016-01-25 NOTE — Consult Note (Signed)
ADDENDUM: MRCP personally reviewed and discussed with Dr. Kerry HoughMemon. No CBD filling defects are visualized with CBD measuring 7 mm, pericholecystic inflammation without wall thickening. Check repeat LFT's later this afternoon and again tomorrow morning. NPO after midnight and scheduled for possible cholecystectomy if LFT's < 1/2 previous level. These findings and recommendations were discussed with patient, her family, and medical team. Please call if any questions.     SURGICAL CONSULTATION NOTE (initial) - cpt: 99254  HISTORY OF PRESENT ILLNESS (HPI):  55 y.o. female with chronic Right-sided abdominal pain every 6-12 months x 8 years with normal gallbladder findings on ultrasounds in 2009, 2014, and 03/2015 as well as HIDA in 2009 [facility] yesterday with acute worsening of RUQ abdominal pain 3 days prior (11/12) that resolved without intervention and then recurred again yesterday with emesis x1, which [facility] Abdominal ultrasound was performed yesterday, which reportedly demonstrated cholelithiasis without cholecystitis or choledocholithiasis. When she woke at 4 am this morning with shaking chills and severe RUQ abdominal pain, she presented accordingly to [facility] ED. She describes her pain is much better than when she first arrived, and she currently denies fever/chills, CP, or SOB. She likewise denies any awareness of her yellow skin discoloration or whether she's previously experienced similar.  Surgery is consulted by ED physician Dr. Lynelle DoctorKnapp in this context for evaluation and management of cholelithiasis, likely choledocholithiasis.  PAST MEDICAL HISTORY (PMH):  Past Medical History:  Diagnosis Date  . Abdominal pain    chronic right sided abd pain, gallbladder w/u negative in 2009 (u/s and HIDA), repeat u/s negative in 2014, 03/2015  . Bell's palsy   . Diverticula of colon   . GERD (gastroesophageal reflux disease)   . Rectocele    . Seasonal allergies   . Stress      PAST SURGICAL HISTORY (PSH):  Past Surgical History:  Procedure Laterality Date  . COLONOSCOPY  01/2008   WJX:BJYNWGNRMR:friable anal canal, pancolonic diverticula, normal terminal ileium  . ESOPHAGOGASTRODUODENOSCOPY  01/11/2008    FAO:ZHYQMVRMR:Normal esophagus, small hiatal hernia, tiny antral erosions.  Otherwise unremarkable gastric mucosa, patent pylorus, normal D1 and D2.   Marland Kitchen. SIGMOIDOSCOPY  02/12/96   interenal hemorrhoids  . TONSILLECTOMY       MEDICATIONS:  Prior to Admission medications   Medication Sig Start Date End Date Taking? Authorizing Provider  albuterol (PROVENTIL HFA;VENTOLIN HFA) 108 (90 Base) MCG/ACT inhaler Inhale into the lungs every 6 (six) hours as needed for wheezing or shortness of breath.    Historical Provider, MD  benzonatate (TESSALON) 100 MG capsule Take 100 mg by mouth daily as needed. 04/02/13   Historical Provider, MD  cetirizine (ZYRTEC) 10 MG chewable tablet Chew 10 mg by mouth at bedtime. allergies    Historical Provider, MD  Fluticasone-Salmeterol (ADVAIR) 250-50 MCG/DOSE AEPB Inhale 1 puff into the lungs 2 (two) times daily.    Historical Provider, MD  gabapentin (NEURONTIN) 100 MG capsule Take 3 capsules (300 mg total) by mouth 3 (three) times daily. Start 1 capsule twice daily x 1 week then three times daily 01/19/16   Micki RileyPramod S Sethi, MD  loratadine (CLARITIN) 10 MG tablet Take 10 mg by mouth at bedtime.     Historical Provider, MD  naproxen sodium (ANAPROX) 220 MG tablet Take 220 mg by mouth 2 (two) times daily with a meal.    Historical Provider, MD  zolpidem (AMBIEN) 10 MG tablet Take 2.5 mg by mouth at bedtime. 1/4  of a pill    Historical Provider, MD     ALLERGIES:  Allergies  Allergen Reactions  . Codeine Itching     SOCIAL HISTORY:  Social History   Social History  . Marital status: Divorced    Spouse name: N/A  . Number of children: 3  . Years of education: N/A   Occupational History  .  [employer]    Social History Main Topics  . Smoking status: Never Smoker  . Smokeless tobacco: Never Used  . Alcohol use No  . Drug use: No  . Sexual activity: Not on file   Other Topics Concern  . Not on file   Social History Narrative  . No narrative on file    The patient currently resides (home / rehab facility / nursing home): Home  The patient normally is (ambulatory / bedbound): Ambulatory   FAMILY HISTORY:  Family History  Problem Relation Age of Onset  . COPD Father     deceased age 65  . Heart disease Mother     deceased age 64s  . Diabetes Mother   . Cirrhosis Brother     etoh  . Bell's palsy Brother   . Stroke Brother   . Breast cancer Maternal Aunt     2 maternal great aunt.  . Breast cancer Paternal Aunt   . Colon cancer Neg Hx      REVIEW OF SYSTEMS:  Constitutional: denies weight loss, fever, chills, or sweats  Eyes: denies any other vision changes, history of eye injury  ENT: denies sore throat, hearing problems  Respiratory: denies shortness of breath, wheezing  Cardiovascular: denies chest pain, palpitations  Gastrointestinal: abdominal pain, N/V, and bowel function as per HPI Genitourinary: denies burning with urination or urinary frequency Musculoskeletal: denies any other joint pains or cramps  Skin: denies any other rashes or skin discolorations  Neurological: denies any other headache, dizziness, weakness  Psychiatric: denies any other depression, anxiety   All other review of systems were negative   VITAL SIGNS:  Temp:  [98.4 F (36.9 C)] 98.4 F (36.9 C) (11/16 0450) Pulse Rate:  [73-87] 73 (11/16 0500) Resp:  [20] 20 (11/16 0450) BP: (112-138)/(70-81) 112/70 (11/16 0500) SpO2:  [97 %-98 %] 98 % (11/16 0500) Weight:  [83.9 kg (185 lb)] 83.9 kg (185 lb) (11/16 0450)     Height: 5\' 4"  (162.6 cm) Weight: 83.9 kg (185 lb) BMI (Calculated): 31.8   INTAKE/OUTPUT:  This shift: No intake/output data recorded.  Last 2 shifts: @IOLAST2SHIFTS @    PHYSICAL EXAM:  Constitutional:  -- Normal body habitus  -- Awake, alert, and oriented x3  Eyes:  -- Pupils equally round and reactive to light  -- +Scleral icterus  Ear, nose, and throat:  -- No jugular venous distension  Pulmonary:  -- No crackles  -- Equal breath sounds bilaterally -- Breathing non-labored at rest Cardiovascular:  -- S1, S2 present  -- No pericardial rubs Gastrointestinal:  -- Abdomen soft, mild-/moderate- RUQ > epigastric abdominal tenderness to palpation, nondistended, no guarding/rebound  -- No abdominal masses appreciated, pulsatile or otherwise  Musculoskeletal and Integumentary:  -- Wounds or skin discoloration: Patient appears jaundiced -- Extremities: B/L UE and LE FROM, hands and feet warm, no edema  Neurologic:  -- Motor function: intact and symmetric -- Sensation: intact and symmetric   Labs:  CBC Latest Ref Rng & Units 01/25/2016 05/06/2013 03/16/2012  WBC 4.0 - 10.5 K/uL 8.6 6.7 8.9  Hemoglobin 12.0 - 15.0 g/dL  13.6 13.4 12.2  Hematocrit 36.0 - 46.0 % 40.5 39.2 38  Platelets 150 - 400 K/uL 202 280 -   CMP Latest Ref Rng & Units 01/25/2016 05/06/2013 10/09/2012  Glucose 65 - 99 mg/dL 191(Y129(H) 782(N107(H) -  BUN 6 - 20 mg/dL 12 9 -  Creatinine 5.620.44 - 1.00 mg/dL 1.300.77 8.650.89 -  Sodium 784135 - 145 mmol/L 138 140 -  Potassium 3.5 - 5.1 mmol/L 3.7 4.2 -  Chloride 101 - 111 mmol/L 104 104 -  CO2 22 - 32 mmol/L 26 25 -  Calcium 8.9 - 10.3 mg/dL 9.7 9.5 -  Total Protein 6.5 - 8.1 g/dL 7.4 7.5 7.2  Total Bilirubin 0.3 - 1.2 mg/dL 5.5(H) 0.5 0.7  Alkaline Phos 38 - 126 U/L 190(H) 64 56  AST 15 - 41 U/L 332(H) 18 17  ALT 14 - 54 U/L 413(H) 16 17    Imaging studies:  Outside [facility] Abdominal Ultrasound (01/24/2016) Reportedly showed cholelithiasis without evidence of cholecystitis (multiple gallstones up to 7 mm), CBD 6.9 mm  Outside Hospital [facility] Abdominal Ultrasound (03/2015) Reportedly normal abdominal ultrasound with no cholelithiasis or  cholecystitis, mildly prominent 7 mm CBD  Abdominal Ultrasound (05/07/2015) No gallstones or gallbladder wall thickening visualized. No sonographic Murphy sign noted. Common bile duct diameter measures: 4.7 mm  Assessment/Plan: (ICD-10's: K80.50) 55 y.o. female with likely choledocholithiasis, complicated by pertinent comorbidities including chronic abdominal pain, pan-colonic diverticulosis without bleeding or diverticulitis, GERD, and Bell's palsy.   - NPO, IVF  - IV antibiotics  - pain control prn (fentanyl preferred)  - consult GI for biliary ductal evaluation and possible ERCP  - anticipate cholecystectomy this admission   - DVT prophylaxis  All of the above findings and recommendations were discussed with the patient and her family, and all of patient's and her family's questions were answered to their expressed satisfaction.  Thank you for the opportunity to participate in this patient's care.   -- Scherrie GerlachJason E. Earlene Plateravis, MD, RPVI [place]: [facility] Office: [phone]

## 2016-01-26 ENCOUNTER — Encounter (HOSPITAL_COMMUNITY)
Admission: EM | Disposition: A | Discharge status: Home / Self Care | Payer: Self-pay | Source: Home / Self Care | Attending: Internal Medicine

## 2016-01-26 ENCOUNTER — Encounter (HOSPITAL_COMMUNITY): Payer: Self-pay | Admitting: Anesthesiology

## 2016-01-26 DIAGNOSIS — G9340 Encephalopathy, unspecified: Secondary | ICD-10-CM

## 2016-01-26 LAB — HEPATIC FUNCTION PANEL
ALBUMIN: 3.4 g/dL — AB (ref 3.5–5.0)
ALK PHOS: 161 U/L — AB (ref 38–126)
ALT: 211 U/L — ABNORMAL HIGH (ref 14–54)
AST: 79 U/L — AB (ref 15–41)
Bilirubin, Direct: 3.2 mg/dL — ABNORMAL HIGH (ref 0.1–0.5)
Indirect Bilirubin: 1.4 mg/dL — ABNORMAL HIGH (ref 0.3–0.9)
TOTAL PROTEIN: 6.5 g/dL (ref 6.5–8.1)
Total Bilirubin: 4.6 mg/dL — ABNORMAL HIGH (ref 0.3–1.2)

## 2016-01-26 SURGERY — LAPAROSCOPIC CHOLECYSTECTOMY
Anesthesia: General

## 2016-01-26 MED ORDER — HYDROCODONE-ACETAMINOPHEN 5-325 MG PO TABS: 1.0000 | ORAL_TABLET | Freq: Four times a day (QID) | ORAL | Status: DC | PRN

## 2016-01-26 MED ORDER — NALOXONE HCL 0.4 MG/ML IJ SOLN
0.4000 mg | Freq: Once | INTRAMUSCULAR | Status: AC
  Administered 2016-01-26: 0.4 mg via INTRAVENOUS

## 2016-01-26 MED ORDER — CHLORHEXIDINE GLUCONATE CLOTH 2 % EX PADS
6.0000 | MEDICATED_PAD | Freq: Every day | CUTANEOUS | Status: DC
  Administered 2016-01-26 – 2016-01-30 (×4): 6 via TOPICAL

## 2016-01-26 MED ORDER — DOCUSATE SODIUM 100 MG PO CAPS
100.0000 mg | ORAL_CAPSULE | Freq: Two times a day (BID) | ORAL | Status: DC
  Administered 2016-01-26 – 2016-01-30 (×2): 100 mg via ORAL
  Filled 2016-01-26 (×3): qty 1

## 2016-01-26 MED ORDER — MUPIROCIN 2 % EX OINT
1.0000 "application " | TOPICAL_OINTMENT | Freq: Two times a day (BID) | CUTANEOUS | Status: DC
  Administered 2016-01-26 – 2016-01-30 (×6): 1 via NASAL
  Filled 2016-01-26 (×2): qty 22

## 2016-01-26 MED ORDER — KETOROLAC TROMETHAMINE 30 MG/ML IJ SOLN
30.0000 mg | INTRAMUSCULAR | Status: AC
  Administered 2016-01-26: 30 mg via INTRAVENOUS
  Filled 2016-01-26: qty 1

## 2016-01-26 MED ORDER — MIDAZOLAM HCL 2 MG/2ML IJ SOLN
INTRAMUSCULAR | Status: AC
  Filled 2016-01-26: qty 2

## 2016-01-26 NOTE — Progress Notes (Signed)
SURGICAL PROGRESS NOTE (cpt [id number])  Hospital Day(s): 1.   Post op day(s):  Marland Kitchen.   Interval History: Patient seen and examined, lethargic and non-responsive to verbal stimuli this morning, attributable to narcotics and reversed with Narcan. Following reversal, patient complained of RUQ > epigastric abdominal pain, but otherwise and since, she reports her pain has been controlled and improving and denies CP, SOB, or N/V.  Review of Systems:  Constitutional: denies fever, chills  HEENT: denies cough or congestion  Respiratory: denies any shortness of breath  Cardiovascular: denies chest pain or palpitations  Gastrointestinal: denies N/V, diarrhea or constipation  Musculoskeletal: denies pain, decreased motor or sensation  Neurological: denies HA or vision/hearing changes   Vital signs in last 24 hours: [min-max] current  Temp:  [98.5 F (36.9 C)-100.9 F (38.3 C)] 99.6 F (37.6 C) (11/17 0509) Pulse Rate:  [64-84] 70 (11/17 0509) Resp:  [14-18] 14 (11/17 0509) BP: (108-122)/(54-59) 119/54 (11/17 0509) SpO2:  [91 %-96 %] 96 % (11/17 0846)     Height: 5\' 4"  (162.6 cm) Weight: 83.9 kg (185 lb) BMI (Calculated): 31.8   Intake/Output this shift:  No intake/output data recorded.   Intake/Output last 2 shifts:  @IOLAST2SHIFTS @   Physical Exam:  Constitutional: alert, cooperative and no distress  HENT: normocephalic without obvious abnormality  Eyes: PERRL, EOM's grossly intact and symmetric  Neuro: CN II - XII grossly intact and symmetric without deficit  Respiratory: breathing non-labored at rest  Cardiovascular: regular rate and sinus rhythm  Gastrointestinal: soft, mild RUQ tenderness to palpation, non-distended  Musculoskeletal: UE and LE FROM, motor and sensation grossly intact  Labs:  CBC Latest Ref Rng & Units 01/25/2016 05/06/2013 03/16/2012  WBC 4.0 - 10.5 K/uL 8.6 6.7 8.9  Hemoglobin 12.0 - 15.0 g/dL 60.413.6 54.013.4 98.112.2  Hematocrit 36.0 - 46.0 % 40.5 39.2 38  Platelets 150 -  400 K/uL 202 280 -   CMP Latest Ref Rng & Units 01/26/2016 01/25/2016 05/06/2013  Glucose 65 - 99 mg/dL - 191(Y129(H) 782(N107(H)  BUN 6 - 20 mg/dL - 12 9  Creatinine 5.620.44 - 1.00 mg/dL - 1.300.77 8.650.89  Sodium 784135 - 145 mmol/L - 138 140  Potassium 3.5 - 5.1 mmol/L - 3.7 4.2  Chloride 101 - 111 mmol/L - 104 104  CO2 22 - 32 mmol/L - 26 25  Calcium 8.9 - 10.3 mg/dL - 9.7 9.5  Total Protein 6.5 - 8.1 g/dL 6.5 7.4 7.5  Total Bilirubin 0.3 - 1.2 mg/dL 4.6(H) 5.5(H) 0.5  Alkaline Phos 38 - 126 U/L 161(H) 190(H) 64  AST 15 - 41 U/L 79(H) 332(H) 18  ALT 14 - 54 U/L 211(H) 413(H) 16    Imaging studies:  MRCP (01/25/2016) Motion degraded images, limiting evaluation.  Mild cholelithiasis. Pericholecystic fluid/inflammatory changes, progressed from recent ultrasound, but without gallbladder wall thickening.  Mild periportal edema. Mildly prominent common duct, measuring 7 mm. No choledocholithiasis is seen.  Overall appearance favors a right upper quadrant inflammatory process such as hepatitis or early acute cholecystitis, recent passage of a common duct stone, or a nonvisualized distal CBD stone or stricture.  If there is clinical concern for acute cholecystitis, consider hepatobiliary nuclear medicine scan. Otherwise, consider ERCP to assess for nonvisualized common duct stone/stricture.  Assessment/Plan: (ICD-10's: 7K80.50) 55 y.o. female with resolving choledocholithiasis +/- early mild cholecystitis, complicated by pertinent comorbidities including chronic abdominal pain, pan-colonic diverticulosis without bleeding or diverticulitis, GERD, and Bell's palsy.              -  cholecystectomy considered for today rescheduled for Monday, 11/20, due to persistently elevated T. bilirubin of 4.6 to reduce the risk of patient requiring post-operative ERCP or re-operative surgery for retained CBD gallstone             - continue IV Rocephin in context of likely resolving choledocholithiasis              - pain control prn, though minimize narcotics (fentanyl or tramadol preferred if necessary)             - GI consultation and recommendations appreciated             - okay to continue clear liquids until surgery  - ambulation encouraged  - DVT prophylaxis  All of the above findings and recommendations were discussed with the patient, her family, medical and GI teams, and all of patient's and her family's questions were answered to their expressed satisfaction.  Thank you for the opportunity to participate in this patient's care.   -- Scherrie GerlachJason E. Earlene Plateravis, MD, RPVI [place]: [facility] Office: [phone]

## 2016-01-26 NOTE — Progress Notes (Signed)
Notified to come to the room to see the patient because she was appearing to have seizure like activity.  Dr. Kerry HoughMemon was at the bedside with the patient.  MD requested Narcan at the bedside.  Med given and patient experienced severe generalized pain.  New orders given and completed.

## 2016-01-26 NOTE — Progress Notes (Signed)
PROGRESS NOTE    ADRENA NAKAMURA  WUJ:[id number] DOB: 07-31-1960 DOA: 01/25/2016 PCP: Lovey Newcomer, PA    Brief Narrative: 55 yof with a history of Bell's palsy, GERD, and chronic  RUQ abdominal pain, presents with complaints of sever right upper quadrant abdominal pain and nausea. Patient states she was evaluated in the Emergency Department at [facility] with abdominal ultrasound demonstrating cholelithiasis without acute cholecystitis. While in the ED imaging revealed mild cholelithiasis. Serology was unremarkable except for elevated LFTs. MRCP did now show any CBD stones, but it was felt that patient may have recently passed a stone. She was admitted for further evaluation of cholecystitis.   Assessment & Plan:   Principal Problem:   Obstructive jaundice Active Problems:   GERD (gastroesophageal reflux disease)   Abdominal pain   Cholelithiasis   Hyperbilirubinemia   Abnormal LFTs   Choledocholithiasis   Biliary obstruction   RUQ pain  1. Obstructive jaundice/Cholelithiasis. Patient reports of several years of RUQ pain. Pain has worsened significantly over the days leading up to admission and abdominal US from outside hospital from 11/15 reportedly demonstrates cholelithiasis without acute cholecystitis. It is likely that she had recently passed a stone. Transaminases have improved since admission, but bilirubin is lacking. GI and general surgery following. She will likely need cholecystectomy. Discussed with Dr. Earlene Plater and plan is for surgery on 11/20. Patient be kept on clear liquids for now. Continue supportive treatment. Continue to trend LFTs. Continue the patient on Rocephin.  2. Acute encephalopathy. Patient was found to be unresponsive today, after receiving intravenous pain medications. She received a dose of Narcan which promptly resolved her symptoms. We'll discontinue further IV narcotics. Please on oral pain medication..  3. GERD. Continue IV Pepcid.   DVT prophylaxis:  Heparin  Code Status: Full  Family Communication: Daughter atbedside Disposition Plan: Discharge home once improved.   Consultants:   General surgery   GI   Procedures:   None   Antimicrobials:   Rocephin 11/17 >>   Subjective: Patient was not responding to voice. She was extremely lethargic. Received a dose of Narcan with improvement of symptoms.  Objective: Vitals:   01/25/16 1158 01/25/16 1530 01/25/16 2240 01/26/16 0509  BP:  (!) 108/58 (!) 122/59 (!) 119/54  Pulse:  64 84 70  Resp:  18 15 14   Temp: (!) 100.5 F (38.1 C) 98.5 F (36.9 C) (!) 100.9 F (38.3 C) 99.6 F (37.6 C)  TempSrc:  Oral Oral Oral  SpO2:  91% 92% 95%  Weight:      Height:        Intake/Output Summary (Last 24 hours) at 01/26/16 0736 Last data filed at 01/26/16 0511  Gross per 24 hour  Intake              340 ml  Output              800 ml  Net             -460 ml   Filed Weights   01/25/16 0450  Weight: 83.9 kg (185 lb)    Examination:  General exam: Appears calm and comfortable  Respiratory system: Clear to auscultation. Respiratory effort normal. Cardiovascular system: S1 & S2 heard, RRR. No JVD, murmurs, rubs, gallops or clicks. No pedal edema. Gastrointestinal system: Abdomen is nondistended, soft and Tender in right upper quadrant. No organomegaly or masses felt. Normal bowel sounds heard. Central nervous system: Alert and oriented. No focal neurological deficits. Extremities: Symmetric 5  x 5 power. Skin: No rashes, lesions or ulcers Psychiatry: Lethargic    Data Reviewed: I have personally reviewed following labs and imaging studies  CBC:  Recent Labs Lab 01/25/16 0455  WBC 8.6  HGB 13.6  HCT 40.5  MCV 94.0  PLT 202   Basic Metabolic Panel:  Recent Labs Lab 01/25/16 0455  NA 138  K 3.7  CL 104  CO2 26  GLUCOSE 129*  BUN 12  CREATININE 0.77  CALCIUM 9.7   GFR: Estimated Creatinine Clearance: 83.3 mL/min (by C-G formula based on SCr of 0.77  mg/dL). Liver Function Tests:  Recent Labs Lab 01/25/16 0455  AST 332*  ALT 413*  ALKPHOS 190*  BILITOT 5.5*  PROT 7.4  ALBUMIN 4.1    Recent Labs Lab 01/25/16 0455  LIPASE 44   No results for input(s): AMMONIA in the last 168 hours. Coagulation Profile:  Recent Labs Lab 01/25/16 0455  INR 0.90   Cardiac Enzymes: No results for input(s): CKTOTAL, CKMB, CKMBINDEX, TROPONINI in the last 168 hours. BNP (last 3 results) No results for input(s): PROBNP in the last 8760 hours. HbA1C: No results for input(s): HGBA1C in the last 72 hours. CBG: No results for input(s): GLUCAP in the last 168 hours. Lipid Profile: No results for input(s): CHOL, HDL, LDLCALC, TRIG, CHOLHDL, LDLDIRECT in the last 72 hours. Thyroid Function Tests: No results for input(s): TSH, T4TOTAL, FREET4, T3FREE, THYROIDAB in the last 72 hours. Anemia Panel: No results for input(s): VITAMINB12, FOLATE, FERRITIN, TIBC, IRON, RETICCTPCT in the last 72 hours. Sepsis Labs: No results for input(s): PROCALCITON, LATICACIDVEN in the last 168 hours.  Recent Results (from the past 240 hour(s))  Surgical PCR screen     Status: Abnormal   Collection Time: 01/25/16  6:17 PM  Result Value Ref Range Status   MRSA, PCR NEGATIVE NEGATIVE Final   Staphylococcus aureus POSITIVE (A) NEGATIVE Final    Comment:        The Xpert SA Assay (FDA approved for NASAL specimens in patients over 72 years of age), is one component of a comprehensive surveillance program.  Test performance has been validated by [facility] for patients greater than or equal to 35 year old. It is not intended to diagnose infection nor to guide or monitor treatment. RESULT CALLED TO, READ BACK BY AND VERIFIED WITH: GLENN,D ON 01/25/16 AT 2235 BY LOY,C          Radiology Studies: Mr 3d Recon At Scanner  Result Date: 01/25/2016 CLINICAL DATA:  Right upper quadrant pain x2 weeks, abnormal LFTs EXAM: MRI ABDOMEN WITHOUT AND WITH CONTRAST  (INCLUDING MRCP) TECHNIQUE: Multiplanar multisequence MR imaging of the abdomen was performed both before and after the administration of intravenous contrast. Heavily T2-weighted images of the biliary and pancreatic ducts were obtained, and three-dimensional MRCP images were rendered by post processing. CONTRAST:  15mL MULTIHANCE GADOBENATE DIMEGLUMINE 529 MG/ML IV SOLN COMPARISON:  Abdominal ultrasound dated 01/24/2016 FINDINGS: Severely motion degraded images. Lower chest: Lung bases are essentially clear. Hepatobiliary: 12 mm cyst in the medial segment left hepatic dome (series 4/ image 11). No hepatic steatosis. No suspicious/enhancing hepatic lesions. Suspected tiny layering gallstones (series 4/ image 35). No definite gallbladder wall thickening. Trace pericholecystic fluid. Mild periportal edema. No intrahepatic ductal dilatation. Mildly prominent common duct, measuring 7 mm, within the upper limits of normal. No choledocholithiasis is seen. Pancreas:  Within normal limits. Spleen:  Within normal limits. Adrenals/Urinary Tract:  Adrenal glands are within normal limits. Small bilateral  renal cysts, measuring up to 1.6 cm in the posterior right upper kidney (series 4/image 28). No hydronephrosis. Stomach/Bowel: Stomach is within normal limits. Visualized bowel is unremarkable. Vascular/Lymphatic:  No evidence of abdominal aortic aneurysm. No suspicious abdominal lymphadenopathy. Other:  No abdominal ascites. Musculoskeletal: No focal osseous lesions. IMPRESSION: Motion degraded images, limiting evaluation. Mild cholelithiasis. Pericholecystic fluid/inflammatory changes, progressed from recent ultrasound, but without gallbladder wall thickening. Mild periportal edema. Mildly prominent common duct, measuring 7 mm. No choledocholithiasis is seen. Overall appearance favors a right upper quadrant inflammatory process such as hepatitis or early acute cholecystitis, recent passage of a common duct stone, or a  nonvisualized distal CBD stone or stricture. If there is clinical concern for acute cholecystitis, consider hepatobiliary nuclear medicine scan. Otherwise, consider ERCP to assess for nonvisualized common duct stone/stricture. Electronically Signed   By: Charline BillsSriyesh  Krishnan M.D.   On: 01/25/2016 12:20   Mr Abdomen Mrcp W Wo Contast  Result Date: 01/25/2016 CLINICAL DATA:  Right upper quadrant pain x2 weeks, abnormal LFTs EXAM: MRI ABDOMEN WITHOUT AND WITH CONTRAST (INCLUDING MRCP) TECHNIQUE: Multiplanar multisequence MR imaging of the abdomen was performed both before and after the administration of intravenous contrast. Heavily T2-weighted images of the biliary and pancreatic ducts were obtained, and three-dimensional MRCP images were rendered by post processing. CONTRAST:  15mL MULTIHANCE GADOBENATE DIMEGLUMINE 529 MG/ML IV SOLN COMPARISON:  Abdominal ultrasound dated 01/24/2016 FINDINGS: Severely motion degraded images. Lower chest: Lung bases are essentially clear. Hepatobiliary: 12 mm cyst in the medial segment left hepatic dome (series 4/ image 11). No hepatic steatosis. No suspicious/enhancing hepatic lesions. Suspected tiny layering gallstones (series 4/ image 35). No definite gallbladder wall thickening. Trace pericholecystic fluid. Mild periportal edema. No intrahepatic ductal dilatation. Mildly prominent common duct, measuring 7 mm, within the upper limits of normal. No choledocholithiasis is seen. Pancreas:  Within normal limits. Spleen:  Within normal limits. Adrenals/Urinary Tract:  Adrenal glands are within normal limits. Small bilateral renal cysts, measuring up to 1.6 cm in the posterior right upper kidney (series 4/image 28). No hydronephrosis. Stomach/Bowel: Stomach is within normal limits. Visualized bowel is unremarkable. Vascular/Lymphatic:  No evidence of abdominal aortic aneurysm. No suspicious abdominal lymphadenopathy. Other:  No abdominal ascites. Musculoskeletal: No focal osseous  lesions. IMPRESSION: Motion degraded images, limiting evaluation. Mild cholelithiasis. Pericholecystic fluid/inflammatory changes, progressed from recent ultrasound, but without gallbladder wall thickening. Mild periportal edema. Mildly prominent common duct, measuring 7 mm. No choledocholithiasis is seen. Overall appearance favors a right upper quadrant inflammatory process such as hepatitis or early acute cholecystitis, recent passage of a common duct stone, or a nonvisualized distal CBD stone or stricture. If there is clinical concern for acute cholecystitis, consider hepatobiliary nuclear medicine scan. Otherwise, consider ERCP to assess for nonvisualized common duct stone/stricture. Electronically Signed   By: Charline BillsSriyesh  Krishnan M.D.   On: 01/25/2016 12:20        Scheduled Meds: . cefTRIAXone (ROCEPHIN)  IV  2 g Intravenous Q24H  . Chlorhexidine Gluconate Cloth  6 each Topical Daily  . famotidine (PEPCID) IV  20 mg Intravenous Q12H  . gabapentin  300 mg Oral TID  . heparin  5,000 Units Subcutaneous Q8H  . loratadine  10 mg Oral QHS  . mometasone-formoterol  1 puff Inhalation BID  . mupirocin ointment  1 application Nasal BID  . zolpidem  2.5 mg Oral QHS   Continuous Infusions:   LOS: 1 day    Time spent: 25 minutes     Erick BlinksJehanzeb Orva Riles, MD [facility] If 7PM-7AM, please contact night-coverage [email or web address] Password [email or web address] 01/26/2016, 7:36 AM

## 2016-01-26 NOTE — Progress Notes (Signed)
Following peripherally. Patient labs with significant improvement in transaminases (AST/ALT 79/211), bili and alk phos improved but lagging (as expected) to bili 3.2, alk phos 161.   MRCP consistent with likely recently passed stone, no obvious remaining obstructive stone. No indication for ERCP at this time.  Spoke with surgeon who is planing on cholecystectomy on Monday when bili has come down by half. Will continue to monitor labs over the weekend for any changes.

## 2016-01-27 LAB — HEPATIC FUNCTION PANEL
ALBUMIN: 3.4 g/dL — AB (ref 3.5–5.0)
ALK PHOS: 160 U/L — AB (ref 38–126)
ALT: 148 U/L — AB (ref 14–54)
AST: 45 U/L — AB (ref 15–41)
Bilirubin, Direct: 1.2 mg/dL — ABNORMAL HIGH (ref 0.1–0.5)
Indirect Bilirubin: 0.9 mg/dL (ref 0.3–0.9)
TOTAL PROTEIN: 6.6 g/dL (ref 6.5–8.1)
Total Bilirubin: 2.1 mg/dL — ABNORMAL HIGH (ref 0.3–1.2)

## 2016-01-27 LAB — URINE CULTURE: CULTURE: NO GROWTH

## 2016-01-27 MED ORDER — TOPIRAMATE 25 MG PO TABS
25.0000 mg | ORAL_TABLET | Freq: Two times a day (BID) | ORAL | Status: DC
  Filled 2016-01-27: qty 1

## 2016-01-27 NOTE — Progress Notes (Signed)
Pt refused her Dulera inhaler. RT will continue to monitor.

## 2016-01-27 NOTE — Progress Notes (Signed)
Patient very apprehensive about taking her medications scheduled here in the hospital.  She has refused most of her ordered medications.  Dr. Kerry HoughMemon was made aware.

## 2016-01-27 NOTE — Progress Notes (Signed)
SURGICAL PROGRESS NOTE (cpt [phone])  Hospital Day(s): 2.   Post op day(s):  Marland Kitchen.   Interval History: Patient seen and examined, no acute events or new complaints overnight. Patient reports she feels nearly completely better and denies abdominal pain, N/V, CP, SOB, or fever/chills and has been ambulating and tolerating CLD without difficulty.  Review of Systems:  Constitutional: denies fever, chills  HEENT: denies cough or congestion  Respiratory: denies any shortness of breath  Cardiovascular: denies chest pain or palpitations  Gastrointestinal: denies N/V, diarrhea or constipation  Musculoskeletal: denies pain, decreased motor or sensation  Neurological: denies HA or vision/hearing changes   Vital signs in last 24 hours: [min-max] current  Temp:  [98.2 F (36.8 C)-99.5 F (37.5 C)] 98.3 F (36.8 C) (11/18 0707) Pulse Rate:  [70-79] 70 (11/18 0707) Resp:  [16] 16 (11/18 0707) BP: (125-154)/(55-82) 154/82 (11/18 0707) SpO2:  [95 %-97 %] 95 % (11/18 0707) Weight:  [84.4 kg (186 lb 1.6 oz)] 84.4 kg (186 lb 1.6 oz) (11/18 0707)     Height: 5\' 4"  (162.6 cm) Weight: 84.4 kg (186 lb 1.6 oz) BMI (Calculated): 31.8   Intake/Output this shift:  Total I/O In: 240 [P.O.:240] Out: 1250 [Urine:1250]   Intake/Output last 2 shifts:  @IOLAST2SHIFTS @   Physical Exam:  Constitutional: alert, cooperative, NAD, much less jaundiced HENT: normocephalic without obvious abnormality  Eyes: PERRL, EOM's grossly intact and symmetric  Neuro: CN II - XII grossly intact and symmetric without deficit  Respiratory: breathing non-labored at rest  Cardiovascular: regular rate and sinus rhythm  Gastrointestinal: soft, non-tender, and non-distended  Musculoskeletal: UE and LE FROM, no edema or wounds, motor and sensation grossly intact, NT   Labs:  CBC Latest Ref Rng & Units 01/25/2016 05/06/2013 03/16/2012  WBC 4.0 - 10.5 K/uL 8.6 6.7 8.9  Hemoglobin 12.0 - 15.0 g/dL 60.413.6 54.013.4 98.112.2  Hematocrit 36.0 - 46.0  % 40.5 39.2 38  Platelets 150 - 400 K/uL 202 280 -   CMP Latest Ref Rng & Units 01/27/2016 01/26/2016 01/25/2016  Glucose 65 - 99 mg/dL - - 191(Y129(H)  BUN 6 - 20 mg/dL - - 12  Creatinine 7.820.44 - 1.00 mg/dL - - 9.560.77  Sodium 213135 - 145 mmol/L - - 138  Potassium 3.5 - 5.1 mmol/L - - 3.7  Chloride 101 - 111 mmol/L - - 104  CO2 22 - 32 mmol/L - - 26  Calcium 8.9 - 10.3 mg/dL - - 9.7  Total Protein 6.5 - 8.1 g/dL 6.6 6.5 7.4  Total Bilirubin 0.3 - 1.2 mg/dL 2.1(H) 4.6(H) 5.5(H)  Alkaline Phos 38 - 126 U/L 160(H) 161(H) 190(H)  AST 15 - 41 U/L 45(H) 79(H) 332(H)  ALT 14 - 54 U/L 148(H) 211(H) 413(H)    Imaging studies: No new pertinent imaging studies   Assessment/Plan: (ICD-10's: K80.50) 55 y.o.femalewith resolving choledocholithiasis +/- early mild cholecystitis, complicated by pertinent comorbidities including chronic abdominal pain, pan-colonic diverticulosis without bleeding or diverticulitis, GERD, and Bell's palsy.  - cholecystectomy rescheduled for Monday, 11/20, due to persistently elevated T. bilirubin of 4.6 yesterday to reduce the risk of patient requiring post-operative ERCP or re-operative surgery for retained CBD gallstone  - all risks, benefits, and alternatives to cholecystectomy were discussed with the patient and her family, patient elects to proceed, all of her and her family's questions were answered to their expressed satisfaction, and informed consent was obtained - pain control prn, though minimize narcotics (fentanyl or tramadol preferred if necessary) - continue IV Rocephin in  context of likely resolving choledocholithiasis - continue clear liquids until surgery             - ambulation encouraged             - DVT prophylaxis  All of the above findings and recommendations were discussed with the patient, her family, medical and GI teams, and all of patient's and her family's questions were answered to their expressed  satisfaction.  Thank you for the opportunity to participate in this patient's care.   -- Scherrie GerlachJason E. Earlene Plateravis, MD, RPVI [place]: [facility] Office: [phone]

## 2016-01-27 NOTE — Progress Notes (Signed)
PROGRESS NOTE    Dana Clarke  GNF:[id number] DOB: 27-Oct-1960 DOA: 01/25/2016 PCP: Lovey Newcomer, PA    Brief Narrative: 55 yof with a history of Bell's palsy, GERD, and chronic  RUQ abdominal pain, presents with complaints of sever right upper quadrant abdominal pain and nausea. Patient states she was evaluated in the Emergency Department at [facility] with abdominal ultrasound demonstrating cholelithiasis without acute cholecystitis. While in the ED imaging revealed mild cholelithiasis. Serology was unremarkable except for elevated LFTs. MRCP did now show any CBD stones, but it was felt that patient may have recently passed a stone. She was admitted for further evaluation of cholecystitis. On 11/17 patient was found to be unresponsive, after receiving IV pain medications. She was given Narcan which immediately resolved her symptoms. Plan is for cholecystectomy on 11/20 as her LFTs improve  Assessment & Plan:   Principal Problem:   Obstructive jaundice Active Problems:   GERD (gastroesophageal reflux disease)   Abdominal pain   Cholelithiasis   Hyperbilirubinemia   Abnormal LFTs   Choledocholithiasis   Biliary obstruction   RUQ pain  1. Obstructive jaundice/Cholelithiasis. Patient reports of several years of RUQ pain. Pain has worsened significantly over the days leading up to admission and abdominal US from outside hospital from 11/15 reportedly demonstrates cholelithiasis without acute cholecystitis. It is likely that she had recently passed a stone. Transaminases have improved since admission, and bilirubin is also improving. GI and general surgery following. She will likely need cholecystectomy. Discussed with Dr. Earlene Plater and plan is for surgery on 11/20. Patient be kept on clear liquids for now. Continue supportive treatment. Continue to trend LFTs. Continue the patient on Rocephin.  2. Acute encephalopathy. Patient was found to be unresponsive on 11/17, after receiving intravenous  pain medications. She received a dose of Narcan which promptly resolved her symptoms. We'll discontinue further IV narcotics. Place on oral pain medication. Mental status is back to baseline. 3. GERD. Continue IV Pepcid.   DVT prophylaxis: Heparin  Code Status: Full  Family Communication: discussed with family in room Disposition Plan: Discharge home once improved.   Consultants:   General surgery   GI   Procedures:   None   Antimicrobials:   Rocephin 11/17 >>   Subjective: Feeling better today. No vomiting. Abdominal pain is controled. Tolerating clear liquids  Objective: Vitals:   01/26/16 0846 01/26/16 1254 01/26/16 2103 01/27/16 0707  BP:  (!) 125/55 (!) 145/71 (!) 154/82  Pulse:  79 76 70  Resp:  16 16 16   Temp:  99.5 F (37.5 C) 98.2 F (36.8 C) 98.3 F (36.8 C)  TempSrc:  Oral Oral Oral  SpO2: 96% 95% 97% 95%  Weight:    84.4 kg (186 lb 1.6 oz)  Height:        Intake/Output Summary (Last 24 hours) at 01/27/16 0724 Last data filed at 01/27/16 0707  Gross per 24 hour  Intake              460 ml  Output             1800 ml  Net            -1340 ml   Filed Weights   01/25/16 0450 01/27/16 0707  Weight: 83.9 kg (185 lb) 84.4 kg (186 lb 1.6 oz)    Examination:  General exam: Appears calm and comfortable  Respiratory system: Clear to auscultation. Respiratory effort normal. Cardiovascular system: S1 & S2 heard, RRR. No JVD, murmurs, rubs,  gallops or clicks. No pedal edema. Gastrointestinal system: Abdomen is nondistended, soft and nontender. No organomegaly or masses felt. Normal bowel sounds heard. Central nervous system: Alert and oriented. No focal neurological deficits. Extremities: Symmetric 5 x 5 power. Skin: No rashes, lesions or ulcers Psychiatry: Judgement and insight appear normal. Mood & affect appropriate.     Data Reviewed: I have personally reviewed following labs and imaging studies  CBC:  Recent Labs Lab 01/25/16 0455  WBC 8.6    HGB 13.6  HCT 40.5  MCV 94.0  PLT 202   Basic Metabolic Panel:  Recent Labs Lab 01/25/16 0455  NA 138  K 3.7  CL 104  CO2 26  GLUCOSE 129*  BUN 12  CREATININE 0.77  CALCIUM 9.7   GFR: Estimated Creatinine Clearance: 83.5 mL/min (by C-G formula based on SCr of 0.77 mg/dL). Liver Function Tests:  Recent Labs Lab 01/25/16 0455 01/26/16 0708  AST 332* 79*  ALT 413* 211*  ALKPHOS 190* 161*  BILITOT 5.5* 4.6*  PROT 7.4 6.5  ALBUMIN 4.1 3.4*    Recent Labs Lab 01/25/16 0455  LIPASE 44   Coagulation Profile:  Recent Labs Lab 01/25/16 0455  INR 0.90     Recent Results (from the past 240 hour(s))  Surgical PCR screen     Status: Abnormal   Collection Time: 01/25/16  6:17 PM  Result Value Ref Range Status   MRSA, PCR NEGATIVE NEGATIVE Final   Staphylococcus aureus POSITIVE (A) NEGATIVE Final    Comment:        The Xpert SA Assay (FDA approved for NASAL specimens in patients over 55 years of age), is one component of a comprehensive surveillance program.  Test performance has been validated by [facility] for patients greater than or equal to 55 year old. It is not intended to diagnose infection nor to guide or monitor treatment. RESULT CALLED TO, READ BACK BY AND VERIFIED WITH: GLENN,D ON 01/25/16 AT 2235 BY LOY,C          Radiology Studies: Mr 3d Recon At Scanner  Result Date: 01/25/2016 CLINICAL DATA:  Right upper quadrant pain x2 weeks, abnormal LFTs EXAM: MRI ABDOMEN WITHOUT AND WITH CONTRAST (INCLUDING MRCP) TECHNIQUE: Multiplanar multisequence MR imaging of the abdomen was performed both before and after the administration of intravenous contrast. Heavily T2-weighted images of the biliary and pancreatic ducts were obtained, and three-dimensional MRCP images were rendered by post processing. CONTRAST:  15mL MULTIHANCE GADOBENATE DIMEGLUMINE 529 MG/ML IV SOLN COMPARISON:  Abdominal ultrasound dated 01/24/2016 FINDINGS: Severely motion  degraded images. Lower chest: Lung bases are essentially clear. Hepatobiliary: 12 mm cyst in the medial segment left hepatic dome (series 4/ image 11). No hepatic steatosis. No suspicious/enhancing hepatic lesions. Suspected tiny layering gallstones (series 4/ image 35). No definite gallbladder wall thickening. Trace pericholecystic fluid. Mild periportal edema. No intrahepatic ductal dilatation. Mildly prominent common duct, measuring 7 mm, within the upper limits of normal. No choledocholithiasis is seen. Pancreas:  Within normal limits. Spleen:  Within normal limits. Adrenals/Urinary Tract:  Adrenal glands are within normal limits. Small bilateral renal cysts, measuring up to 1.6 cm in the posterior right upper kidney (series 4/image 28). No hydronephrosis. Stomach/Bowel: Stomach is within normal limits. Visualized bowel is unremarkable. Vascular/Lymphatic:  No evidence of abdominal aortic aneurysm. No suspicious abdominal lymphadenopathy. Other:  No abdominal ascites. Musculoskeletal: No focal osseous lesions. IMPRESSION: Motion degraded images, limiting evaluation. Mild cholelithiasis. Pericholecystic fluid/inflammatory changes, progressed from recent ultrasound, but without gallbladder wall thickening.  Mild periportal edema. Mildly prominent common duct, measuring 7 mm. No choledocholithiasis is seen. Overall appearance favors a right upper quadrant inflammatory process such as hepatitis or early acute cholecystitis, recent passage of a common duct stone, or a nonvisualized distal CBD stone or stricture. If there is clinical concern for acute cholecystitis, consider hepatobiliary nuclear medicine scan. Otherwise, consider ERCP to assess for nonvisualized common duct stone/stricture. Electronically Signed   By: Charline BillsSriyesh  Krishnan M.D.   On: 01/25/2016 12:20   Mr Abdomen Mrcp W Wo Contast  Result Date: 01/25/2016 CLINICAL DATA:  Right upper quadrant pain x2 weeks, abnormal LFTs EXAM: MRI ABDOMEN WITHOUT AND  WITH CONTRAST (INCLUDING MRCP) TECHNIQUE: Multiplanar multisequence MR imaging of the abdomen was performed both before and after the administration of intravenous contrast. Heavily T2-weighted images of the biliary and pancreatic ducts were obtained, and three-dimensional MRCP images were rendered by post processing. CONTRAST:  15mL MULTIHANCE GADOBENATE DIMEGLUMINE 529 MG/ML IV SOLN COMPARISON:  Abdominal ultrasound dated 01/24/2016 FINDINGS: Severely motion degraded images. Lower chest: Lung bases are essentially clear. Hepatobiliary: 12 mm cyst in the medial segment left hepatic dome (series 4/ image 11). No hepatic steatosis. No suspicious/enhancing hepatic lesions. Suspected tiny layering gallstones (series 4/ image 35). No definite gallbladder wall thickening. Trace pericholecystic fluid. Mild periportal edema. No intrahepatic ductal dilatation. Mildly prominent common duct, measuring 7 mm, within the upper limits of normal. No choledocholithiasis is seen. Pancreas:  Within normal limits. Spleen:  Within normal limits. Adrenals/Urinary Tract:  Adrenal glands are within normal limits. Small bilateral renal cysts, measuring up to 1.6 cm in the posterior right upper kidney (series 4/image 28). No hydronephrosis. Stomach/Bowel: Stomach is within normal limits. Visualized bowel is unremarkable. Vascular/Lymphatic:  No evidence of abdominal aortic aneurysm. No suspicious abdominal lymphadenopathy. Other:  No abdominal ascites. Musculoskeletal: No focal osseous lesions. IMPRESSION: Motion degraded images, limiting evaluation. Mild cholelithiasis. Pericholecystic fluid/inflammatory changes, progressed from recent ultrasound, but without gallbladder wall thickening. Mild periportal edema. Mildly prominent common duct, measuring 7 mm. No choledocholithiasis is seen. Overall appearance favors a right upper quadrant inflammatory process such as hepatitis or early acute cholecystitis, recent passage of a common duct  stone, or a nonvisualized distal CBD stone or stricture. If there is clinical concern for acute cholecystitis, consider hepatobiliary nuclear medicine scan. Otherwise, consider ERCP to assess for nonvisualized common duct stone/stricture. Electronically Signed   By: Charline BillsSriyesh  Krishnan M.D.   On: 01/25/2016 12:20        Scheduled Meds: . cefTRIAXone (ROCEPHIN)  IV  2 g Intravenous Q24H  . Chlorhexidine Gluconate Cloth  6 each Topical Daily  . docusate sodium  100 mg Oral BID  . famotidine (PEPCID) IV  20 mg Intravenous Q12H  . gabapentin  300 mg Oral TID  . heparin  5,000 Units Subcutaneous Q8H  . loratadine  10 mg Oral QHS  . mometasone-formoterol  1 puff Inhalation BID  . mupirocin ointment  1 application Nasal BID  . zolpidem  2.5 mg Oral QHS   Continuous Infusions:   LOS: 2 days    Time spent: 25 minutes     Erick BlinksJehanzeb Yassmin Binegar, MD [facility] If 7PM-7AM, please contact night-coverage [email or web address] Password [email or web address][email or web address] 01/27/2016, 7:24 AM

## 2016-01-28 ENCOUNTER — Inpatient Hospital Stay (HOSPITAL_COMMUNITY): Payer: Federal, State, Local not specified - PPO

## 2016-01-28 LAB — HEPATIC FUNCTION PANEL
ALBUMIN: 3.4 g/dL — AB (ref 3.5–5.0)
ALK PHOS: 184 U/L — AB (ref 38–126)
ALT: 112 U/L — ABNORMAL HIGH (ref 14–54)
AST: 30 U/L (ref 15–41)
BILIRUBIN TOTAL: 1.3 mg/dL — AB (ref 0.3–1.2)
Bilirubin, Direct: 0.6 mg/dL — ABNORMAL HIGH (ref 0.1–0.5)
Indirect Bilirubin: 0.7 mg/dL (ref 0.3–0.9)
Total Protein: 6.7 g/dL (ref 6.5–8.1)

## 2016-01-28 MED ORDER — CHLORHEXIDINE GLUCONATE CLOTH 2 % EX PADS
6.0000 | MEDICATED_PAD | Freq: Once | CUTANEOUS | Status: AC
  Administered 2016-01-28: 6 via TOPICAL

## 2016-01-28 MED ORDER — CHLORHEXIDINE GLUCONATE CLOTH 2 % EX PADS
6.0000 | MEDICATED_PAD | Freq: Once | CUTANEOUS | Status: AC
  Administered 2016-01-28 – 2016-01-29 (×2): 6 via TOPICAL

## 2016-01-28 MED ORDER — FAMOTIDINE 20 MG PO TABS: 20.0000 mg | ORAL_TABLET | Freq: Two times a day (BID) | ORAL | Status: DC | PRN

## 2016-01-28 NOTE — Progress Notes (Signed)
PROGRESS NOTE    Dana Clarke  UJW:[id number] DOB: 12/04/1960 DOA: 01/25/2016 PCP: Dana NewcomerBOYD, WILLIAM S, PA    Brief Narrative:  [age] yof with a history of Bell'Clarke palsy, GERD, and chronic  RUQ abdominal pain, presents with complaints of sever right upper quadrant abdominal pain and nausea. Patient states [facility] ultrasound demonstrating cholelithiasis without acute cholecystitis. While in the ED imaging revealed mild cholelithiasis. Serology was unremarkable except for elevated LFTs. MRCP did now show any CBD stones, but it was felt that patient may have recently passed a stone. She was admitted for further evaluation of cholecystitis. On 11/17 patient was found to be unresponsive, after receiving IV pain medications. She was given Narcan which immediately resolved her symptoms. Plan is for cholecystectomy on 11/20 as her LFTs improve  Assessment & Plan:   Principal Problem:   Obstructive jaundice Active Problems:   GERD (gastroesophageal reflux disease)   Abdominal pain   Cholelithiasis   Hyperbilirubinemia   Abnormal LFTs   Choledocholithiasis   Biliary obstruction   RUQ pain  1. Obstructive jaundice/Cholelithiasis. Patient reports of several years of RUQ pain. Pain has worsened significantly over the days leading up to admission and abdominal US from outside hospital from 11/15 reportedly demonstrates cholelithiasis without acute cholecystitis. It is likely that she had recently passed a stone. Transaminases have improved since admission, and bilirubin is also improving. GI and general surgery following. She will likely need cholecystectomy. Discussed with Dr. Earlene Plateravis and plan is for surgery on 11/20. Patient is to be kept on clear liquids for now. Continue supportive treatment. Continue to trend LFTs. Continue the patient on Rocephin.  2. Acute encephalopathy. Patient was found to be unresponsive on 11/17, after receiving  intravenous pain medications. She received a dose of Narcan which promptly resolved her symptoms. Narcotics have been discontinued. Continue on oral pain medication. Mental status is back to baseline. 3. GERD. Continue pepcid prn.   DVT prophylaxis: Heparin  Code Status: Full  Family Communication: No family bedside Disposition Plan: Discharge home once improved.    Consultants:   General surgery   GI   Procedures:   None   Antimicrobials:   Rocephin 11/17 >>   Subjective: Complains of headache. Some relief with tylenol. She does not want to take any other medications.  Objective: Vitals:   01/27/16 0707 01/27/16 1553 01/27/16 2024 01/28/16 0500  BP: (!) 154/82 (!) 159/86 (!) 156/72 131/73  Pulse: 70 67 64 61  Resp: 16 18 16 18   Temp: 98.3 F (36.8 C) 98.8 F (37.1 C) 98.6 F (37 C) 98.3 F (36.8 C)  TempSrc: Oral Oral Oral Oral  SpO2: 95% 98% 96% 97%  Weight: 84.4 kg (186 lb 1.6 oz)   83.5 kg (184 lb)  Height:        Intake/Output Summary (Last 24 hours) at 01/28/16 0748 Last data filed at 01/28/16 0554  Gross per 24 hour  Intake             1080 ml  Output             2450 ml  Net            -1370 ml   Filed Weights   01/25/16 0450 01/27/16 0707 01/28/16 0500  Weight: 83.9 kg (185 lb) 84.4 kg (186 lb 1.6 oz) 83.5 kg (184 lb)    Examination:  General exam: Appears calm and comfortable  Respiratory system: Clear  to auscultation. Respiratory effort normal. Cardiovascular system: S1 & S2 heard, RRR. No JVD, murmurs, rubs, gallops or clicks. No pedal edema. Gastrointestinal system: Abdomen is nondistended, soft and tender in RUQ. No organomegaly or masses felt. Normal bowel sounds heard. Central nervous system: Alert and oriented. No focal neurological deficits. Extremities: Symmetric 5 x 5 power. Skin: No rashes, lesions or ulcers Psychiatry: Judgement and insight appear normal. Mood & affect appropriate.     Data Reviewed: I have personally reviewed  following labs and imaging studies  CBC:  Recent Labs Lab 01/25/16 0455  WBC 8.6  HGB 13.6  HCT 40.5  MCV 94.0  PLT 202   Basic Metabolic Panel:  Recent Labs Lab 01/25/16 0455  NA 138  K 3.7  CL 104  CO2 26  GLUCOSE 129*  BUN 12  CREATININE 0.77  CALCIUM 9.7   GFR: Estimated Creatinine Clearance: 83 mL/min (by C-G formula based on SCr of 0.77 mg/dL). Liver Function Tests:  Recent Labs Lab 01/25/16 0455 01/26/16 0708 01/27/16 0749  AST 332* 79* 45*  ALT 413* 211* 148*  ALKPHOS 190* 161* 160*  BILITOT 5.5* 4.6* 2.1*  PROT 7.4 6.5 6.6  ALBUMIN 4.1 3.4* 3.4*    Recent Labs Lab 01/25/16 0455  LIPASE 44   No results for input(Clarke): AMMONIA in the last 168 hours. Coagulation Profile:  Recent Labs Lab 01/25/16 0455  INR 0.90   Cardiac Enzymes: No results for input(Clarke): CKTOTAL, CKMB, CKMBINDEX, TROPONINI in the last 168 hours. BNP (last 3 results) No results for input(Clarke): PROBNP in the last 8760 hours. HbA1C: No results for input(Clarke): HGBA1C in the last 72 hours. CBG: No results for input(Clarke): GLUCAP in the last 168 hours. Lipid Profile: No results for input(Clarke): CHOL, HDL, LDLCALC, TRIG, CHOLHDL, LDLDIRECT in the last 72 hours. Thyroid Function Tests: No results for input(Clarke): TSH, T4TOTAL, FREET4, T3FREE, THYROIDAB in the last 72 hours. Anemia Panel: No results for input(Clarke): VITAMINB12, FOLATE, FERRITIN, TIBC, IRON, RETICCTPCT in the last 72 hours. Sepsis Labs: No results for input(Clarke): PROCALCITON, LATICACIDVEN in the last 168 hours.  Recent Results (from the past 240 hour(Clarke))  Urine culture     Status: None   Collection Time: 01/25/16  3:00 PM  Result Value Ref Range Status   Specimen Description URINE, CLEAN CATCH  Final   Special Requests NONE  Final   Culture NO GROWTH Performed at [facility]   Final   Report Status 01/27/2016 FINAL  Final  Surgical PCR screen     Status: Abnormal   Collection Time: 01/25/16  6:17 PM  Result Value  Ref Range Status   MRSA, PCR NEGATIVE NEGATIVE Final   Staphylococcus aureus POSITIVE (A) NEGATIVE Final    Comment:        The Xpert SA Assay (FDA approved for NASAL specimens in patients over 62 years of age), is one component of a comprehensive surveillance program.  Test performance has been validated by [facility] for patients greater than or equal to 51 year old. It is not intended to diagnose infection nor to guide or monitor treatment. RESULT CALLED TO, READ BACK BY AND VERIFIED WITH: GLENN,D ON 01/25/16 AT 2235 BY LOY,C          Radiology Studies: No results found.      Scheduled Meds: . cefTRIAXone (ROCEPHIN)  IV  2 g Intravenous Q24H  . Chlorhexidine Gluconate Cloth  6 each Topical Daily  . docusate sodium  100 mg Oral BID  .  famotidine (PEPCID) IV  20 mg Intravenous Q12H  . gabapentin  300 mg Oral TID  . heparin  5,000 Units Subcutaneous Q8H  . loratadine  10 mg Oral QHS  . mometasone-formoterol  1 puff Inhalation BID  . mupirocin ointment  1 application Nasal BID  . topiramate  25 mg Oral BID  . zolpidem  2.5 mg Oral QHS   Continuous Infusions:   LOS: 3 days    Time spent: 25 minutes     Erick BlinksJehanzeb Memon, MD [facility] If 7PM-7AM, please contact night-coverage [email or web address] Password [email or web address] 01/28/2016, 7:48 AM

## 2016-01-28 NOTE — Progress Notes (Signed)
SURGICAL PROGRESS NOTE (cpt [phone])  Hospital Day(s): 3.   Post op day(s):  Marland Kitchen   Interval History: Patient seen and examined, no acute events or new complaints overnight. Patient reports her abdominal pain has continued to improve, as she has overall, and she denies N/V, fever/chills, CP, or SOB.  Review of Systems:  Constitutional: denies fever, chills  HEENT: denies cough or congestion  Respiratory: denies any shortness of breath  Cardiovascular: denies chest pain or palpitations  Gastrointestinal: denies N/V, diarrhea or constipation  Musculoskeletal: denies pain, decreased motor or sensation  Neurological: denies HA or vision/hearing changes   Vital signs in last 24 hours: [min-max] current  Temp:  [98.3 F (36.8 C)-98.8 F (37.1 C)] 98.3 F (36.8 C) (11/19 0500) Pulse Rate:  [61-67] 61 (11/19 0500) Resp:  [16-18] 18 (11/19 0500) BP: (131-159)/(72-86) 131/73 (11/19 0500) SpO2:  [96 %-98 %] 97 % (11/19 0500) Weight:  [83.5 kg (184 lb)] 83.5 kg (184 lb) (11/19 0500)     Height: '5\' 4"'$  (162.6 cm) Weight: 83.5 kg (184 lb) BMI (Calculated): 31.8   Intake/Output this shift:  No intake/output data recorded.   Intake/Output last 2 shifts:  '@IOLAST2SHIFTS'$ @   Physical Exam:  Constitutional: alert, cooperative and no distress  HENT: normocephalic without obvious abnormality  Eyes: PERRL, EOM's grossly intact and symmetric  Neuro: CN II - XII grossly intact and symmetric without deficit  Respiratory: breathing non-labored at rest  Cardiovascular: regular rate and sinus rhythm  Gastrointestinal: soft, non-tender, and non-distended  Musculoskeletal: UE and LE FROM, no edema or wounds, motor and sensation grossly intact, NT   Labs:  Hepatic Function Latest Ref Rng & Units 01/28/2016 01/27/2016 01/26/2016  Total Protein 6.5 - 8.1 g/dL 6.7 6.6 6.5  Albumin 3.5 - 5.0 g/dL 3.4(L) 3.4(L) 3.4(L)  AST 15 - 41 U/L 30 45(H) 79(H)  ALT 14 - 54 U/L 112(H) 148(H) 211(H)  Alk Phosphatase  38 - 126 U/L 184(H) 160(H) 161(H)  Total Bilirubin 0.3 - 1.2 mg/dL 1.3(H) 2.1(H) 4.6(H)  Bilirubin, Direct 0.1 - 0.5 mg/dL 0.6(H) 1.2(H) 3.2(H)    Imaging studies: No new pertinent imaging studies   Assessment/Plan: (ICD-10's: K80.50) 55 y.o.femalewith resolving choledocholithiasis +/- early mild cholecystitis, complicated by pertinent comorbidities including chronic abdominal pain, pan-colonic diverticulosis without bleeding or diverticulitis, GERD, and Bell's palsy.  - cholecystectomy rescheduled for Monday, 11/20, due to persistently elevated T. bilirubin of 4.6 Friday 11/17 (now 1.3) to reduce the risk of patient requiring post-operative ERCP or re-operative surgery for retained CBD gallstone             - all risks, benefits, and alternatives to cholecystectomy were discussed with the patient and her family, patient elects to proceed, all of her and her family's questions were answered to their expressed satisfaction, and informed consent was obtained - pain control prn, though minimize narcotics(fentanyl or tramadol preferred if necessary) - continue IV Rocephin in context of likely resolving choledocholithiasis - continue clear liquids until surgery - ambulation encouraged - DVT prophylaxis  All of the above findings and recommendations were discussed with the patient,her family, medical and GI teams, and all of patient's and her family's questions were answered to their expressed satisfaction.  Thank you for the opportunity to participate in this patient's care.   -- Marilynne Drivers Rosana Hoes, MD, [place]: [facility] Office: [phone]

## 2016-01-29 ENCOUNTER — Encounter (HOSPITAL_COMMUNITY): Payer: Self-pay

## 2016-01-29 ENCOUNTER — Encounter (HOSPITAL_COMMUNITY)
Admission: EM | Disposition: A | Discharge status: Home / Self Care | Payer: Self-pay | Source: Home / Self Care | Attending: Internal Medicine

## 2016-01-29 ENCOUNTER — Inpatient Hospital Stay (HOSPITAL_COMMUNITY): Payer: Federal, State, Local not specified - PPO | Admitting: Anesthesiology

## 2016-01-29 ENCOUNTER — Inpatient Hospital Stay (HOSPITAL_COMMUNITY): Payer: Federal, State, Local not specified - PPO

## 2016-01-29 HISTORY — PX: CHOLECYSTECTOMY: SHX55

## 2016-01-29 LAB — HEPATIC FUNCTION PANEL
ALK PHOS: 179 U/L — AB (ref 38–126)
ALT: 107 U/L — AB (ref 14–54)
AST: 53 U/L — AB (ref 15–41)
Albumin: 3.7 g/dL (ref 3.5–5.0)
BILIRUBIN DIRECT: 0.5 mg/dL (ref 0.1–0.5)
BILIRUBIN INDIRECT: 0.7 mg/dL (ref 0.3–0.9)
BILIRUBIN TOTAL: 1.2 mg/dL (ref 0.3–1.2)
Total Protein: 7.3 g/dL (ref 6.5–8.1)

## 2016-01-29 SURGERY — LAPAROSCOPIC CHOLECYSTECTOMY
Anesthesia: General | Site: Abdomen

## 2016-01-29 MED ORDER — ONDANSETRON HCL 4 MG/2ML IJ SOLN
INTRAMUSCULAR | Status: AC
Start: 2016-01-29 — End: 2016-01-29
  Filled 2016-01-29: qty 2

## 2016-01-29 MED ORDER — FENTANYL CITRATE (PF) 100 MCG/2ML IJ SOLN
25.0000 ug | INTRAMUSCULAR | Status: DC | PRN
  Administered 2016-01-29: 100 ug via INTRAVENOUS
  Administered 2016-01-29 (×2): 50 ug via INTRAVENOUS
  Filled 2016-01-29: qty 2

## 2016-01-29 MED ORDER — SODIUM CHLORIDE 0.9 % IR SOLN
Status: DC | PRN
  Administered 2016-01-29: 1000 mL

## 2016-01-29 MED ORDER — PROMETHAZINE HCL 25 MG/ML IJ SOLN
INTRAMUSCULAR | Status: AC
  Filled 2016-01-29: qty 1

## 2016-01-29 MED ORDER — SODIUM CHLORIDE 0.9% FLUSH
INTRAVENOUS | Status: AC
  Filled 2016-01-29: qty 10

## 2016-01-29 MED ORDER — ROCURONIUM BROMIDE 50 MG/5ML IV SOLN
INTRAVENOUS | Status: AC
  Filled 2016-01-29: qty 1

## 2016-01-29 MED ORDER — KETOROLAC TROMETHAMINE 30 MG/ML IJ SOLN
30.0000 mg | Freq: Four times a day (QID) | INTRAMUSCULAR | Status: DC
  Administered 2016-01-29 – 2016-01-30 (×3): 30 mg via INTRAVENOUS
  Filled 2016-01-29 (×3): qty 1

## 2016-01-29 MED ORDER — NEOSTIGMINE METHYLSULFATE 10 MG/10ML IV SOLN
INTRAVENOUS | Status: DC | PRN
  Administered 2016-01-29: 4 mg via INTRAVENOUS

## 2016-01-29 MED ORDER — TRAMADOL-ACETAMINOPHEN 37.5-325 MG PO TABS
1.0000 | ORAL_TABLET | Freq: Four times a day (QID) | ORAL | Status: DC | PRN
  Administered 2016-01-29 – 2016-01-30 (×3): 2 via ORAL
  Filled 2016-01-29 (×4): qty 2

## 2016-01-29 MED ORDER — ONDANSETRON HCL 4 MG/2ML IJ SOLN
4.0000 mg | Freq: Once | INTRAMUSCULAR | Status: AC
  Administered 2016-01-29: 4 mg via INTRAVENOUS
  Filled 2016-01-29: qty 2

## 2016-01-29 MED ORDER — LIDOCAINE HCL (CARDIAC) 10 MG/ML IV SOLN
INTRAVENOUS | Status: DC | PRN
  Administered 2016-01-29: 50 mg via INTRAVENOUS

## 2016-01-29 MED ORDER — GLYCOPYRROLATE 0.2 MG/ML IJ SOLN
INTRAMUSCULAR | Status: AC
  Filled 2016-01-29: qty 3

## 2016-01-29 MED ORDER — HEMOSTATIC AGENTS (NO CHARGE) OPTIME
TOPICAL | Status: DC | PRN
  Administered 2016-01-29: 1 via TOPICAL

## 2016-01-29 MED ORDER — MIDAZOLAM HCL 2 MG/2ML IJ SOLN
INTRAMUSCULAR | Status: AC
  Filled 2016-01-29: qty 2

## 2016-01-29 MED ORDER — LIDOCAINE HCL (PF) 1 % IJ SOLN
INTRAMUSCULAR | Status: AC
  Filled 2016-01-29: qty 30

## 2016-01-29 MED ORDER — FENTANYL CITRATE (PF) 100 MCG/2ML IJ SOLN
INTRAMUSCULAR | Status: DC | PRN
  Administered 2016-01-29 (×2): 50 ug via INTRAVENOUS
  Administered 2016-01-29: 25 ug via INTRAVENOUS
  Administered 2016-01-29 (×2): 50 ug via INTRAVENOUS
  Administered 2016-01-29: 25 ug via INTRAVENOUS

## 2016-01-29 MED ORDER — EPHEDRINE SULFATE 50 MG/ML IJ SOLN
INTRAMUSCULAR | Status: DC | PRN
  Administered 2016-01-29 (×2): 10 mg via INTRAVENOUS

## 2016-01-29 MED ORDER — HYDROCODONE-ACETAMINOPHEN 5-325 MG PO TABS: 1.0000 | ORAL_TABLET | Freq: Four times a day (QID) | ORAL | Status: DC | PRN

## 2016-01-29 MED ORDER — LIDOCAINE HCL (PF) 1 % IJ SOLN
INTRAMUSCULAR | Status: AC
  Filled 2016-01-29: qty 5

## 2016-01-29 MED ORDER — SUCCINYLCHOLINE CHLORIDE 20 MG/ML IJ SOLN
INTRAMUSCULAR | Status: DC | PRN
  Administered 2016-01-29: 120 mg via INTRAVENOUS

## 2016-01-29 MED ORDER — LIDOCAINE HCL 1 % IJ SOLN
INTRAMUSCULAR | Status: DC | PRN
  Administered 2016-01-29: 20 mL via INTRAMUSCULAR

## 2016-01-29 MED ORDER — ROCURONIUM BROMIDE 100 MG/10ML IV SOLN
INTRAVENOUS | Status: DC | PRN
  Administered 2016-01-29: 5 mg via INTRAVENOUS
  Administered 2016-01-29: 25 mg via INTRAVENOUS

## 2016-01-29 MED ORDER — PROPOFOL 10 MG/ML IV BOLUS
INTRAVENOUS | Status: DC | PRN
  Administered 2016-01-29: 20 mg via INTRAVENOUS
  Administered 2016-01-29: 100 mg via INTRAVENOUS

## 2016-01-29 MED ORDER — FENTANYL CITRATE (PF) 100 MCG/2ML IJ SOLN
INTRAMUSCULAR | Status: AC
  Filled 2016-01-29: qty 2

## 2016-01-29 MED ORDER — PROMETHAZINE HCL 25 MG/ML IJ SOLN
6.2500 mg | INTRAMUSCULAR | Status: DC | PRN
  Administered 2016-01-29: 12.5 mg via INTRAVENOUS

## 2016-01-29 MED ORDER — BUPIVACAINE HCL (PF) 0.5 % IJ SOLN
INTRAMUSCULAR | Status: AC
  Filled 2016-01-29: qty 30

## 2016-01-29 MED ORDER — LACTATED RINGERS IV SOLN
INTRAVENOUS | Status: DC
  Administered 2016-01-29 (×2): via INTRAVENOUS

## 2016-01-29 MED ORDER — GLYCOPYRROLATE 0.2 MG/ML IJ SOLN
INTRAMUSCULAR | Status: AC
  Filled 2016-01-29: qty 1

## 2016-01-29 MED ORDER — GLYCOPYRROLATE 0.2 MG/ML IJ SOLN
INTRAMUSCULAR | Status: DC | PRN
  Administered 2016-01-29: 0.6 mg via INTRAVENOUS
  Administered 2016-01-29: 0.2 mg via INTRAVENOUS

## 2016-01-29 MED ORDER — NEOSTIGMINE METHYLSULFATE 10 MG/10ML IV SOLN
INTRAVENOUS | Status: AC
  Filled 2016-01-29: qty 1

## 2016-01-29 MED ORDER — FENTANYL CITRATE (PF) 250 MCG/5ML IJ SOLN
INTRAMUSCULAR | Status: AC
  Filled 2016-01-29: qty 5

## 2016-01-29 MED ORDER — TRAMADOL-ACETAMINOPHEN 37.5-325 MG PO TABS: 1.0000 | ORAL_TABLET | Freq: Four times a day (QID) | ORAL | 0 refills | Status: DC | PRN

## 2016-01-29 MED ORDER — MIDAZOLAM HCL 2 MG/2ML IJ SOLN
1.0000 mg | INTRAMUSCULAR | Status: DC | PRN
  Administered 2016-01-29: 2 mg via INTRAVENOUS
  Filled 2016-01-29: qty 2

## 2016-01-29 SURGICAL SUPPLY — 45 items
APPLIER CLIP LAPSCP 10X32 DD (CLIP) ×2 IMPLANT
BAG HAMPER (MISCELLANEOUS) ×2 IMPLANT
CHLORAPREP W/TINT 26ML (MISCELLANEOUS) ×2 IMPLANT
CLOTH BEACON ORANGE TIMEOUT ST (SAFETY) ×2 IMPLANT
COVER LIGHT HANDLE STERIS (MISCELLANEOUS) ×4 IMPLANT
DECANTER SPIKE VIAL GLASS SM (MISCELLANEOUS) ×4 IMPLANT
DERMABOND ADVANCED (GAUZE/BANDAGES/DRESSINGS) ×1
DERMABOND ADVANCED .7 DNX12 (GAUZE/BANDAGES/DRESSINGS) ×1 IMPLANT
DEVICE TROCAR PUNCTURE CLOSURE (ENDOMECHANICALS) ×2 IMPLANT
ELECT REM PT RETURN 9FT ADLT (ELECTROSURGICAL) ×2
ELECTRODE REM PT RTRN 9FT ADLT (ELECTROSURGICAL) ×1 IMPLANT
FILTER SMOKE EVAC LAPAROSHD (FILTER) ×2 IMPLANT
FORMALIN 10 PREFIL 120ML (MISCELLANEOUS) ×2 IMPLANT
GLOVE BIOGEL PI IND STRL 7.0 (GLOVE) ×2 IMPLANT
GLOVE BIOGEL PI IND STRL 7.5 (GLOVE) ×1 IMPLANT
GLOVE BIOGEL PI INDICATOR 7.0 (GLOVE) ×2
GLOVE BIOGEL PI INDICATOR 7.5 (GLOVE) ×1
GLOVE ECLIPSE 6.5 STRL STRAW (GLOVE) ×4 IMPLANT
GLOVE ECLIPSE 7.0 STRL STRAW (GLOVE) ×2 IMPLANT
GLOVE EXAM NITRILE MD LF STRL (GLOVE) ×2 IMPLANT
GOWN STRL REUS W/ TWL XL LVL3 (GOWN DISPOSABLE) ×1 IMPLANT
GOWN STRL REUS W/TWL LRG LVL3 (GOWN DISPOSABLE) ×4 IMPLANT
GOWN STRL REUS W/TWL XL LVL3 (GOWN DISPOSABLE) ×1
HEMOSTAT SNOW SURGICEL 2X4 (HEMOSTASIS) ×2 IMPLANT
INST SET LAPROSCOPIC AP (KITS) ×2 IMPLANT
IV NS IRRIG 3000ML ARTHROMATIC (IV SOLUTION) IMPLANT
KIT ROOM TURNOVER APOR (KITS) ×2 IMPLANT
MANIFOLD NEPTUNE II (INSTRUMENTS) ×2 IMPLANT
NEEDLE INSUFFLATION 14GA 120MM (NEEDLE) ×2 IMPLANT
NS IRRIG 1000ML POUR BTL (IV SOLUTION) ×2 IMPLANT
PACK LAP CHOLE LZT030E (CUSTOM PROCEDURE TRAY) ×2 IMPLANT
PAD ARMBOARD 7.5X6 YLW CONV (MISCELLANEOUS) ×2 IMPLANT
POUCH SPECIMEN RETRIEVAL 10MM (ENDOMECHANICALS) ×2 IMPLANT
SET BASIN LINEN APH (SET/KITS/TRAYS/PACK) ×2 IMPLANT
SET TUBE IRRIG SUCTION NO TIP (IRRIGATION / IRRIGATOR) IMPLANT
SLEEVE ENDOPATH XCEL 5M (ENDOMECHANICALS) ×4 IMPLANT
SUT MON AB 4-0 RB1 27 (SUTURE) ×2 IMPLANT
SUT VIC AB 4-0 PS2 27 (SUTURE) ×2 IMPLANT
SUT VICRYL 0 UR6 27IN ABS (SUTURE) ×2 IMPLANT
SUT VICRYL AB 3-0 FS1 BRD 27IN (SUTURE) ×2 IMPLANT
TROCAR ENDO BLADELESS 11MM (ENDOMECHANICALS) ×2 IMPLANT
TROCAR XCEL NON-BLD 5MMX100MML (ENDOMECHANICALS) ×2 IMPLANT
TUBE CONNECTING 12X1/4 (SUCTIONS) IMPLANT
TUBING INSUFFLATION (TUBING) ×2 IMPLANT
WARMER LAPAROSCOPE (MISCELLANEOUS) ×2 IMPLANT

## 2016-01-29 NOTE — Progress Notes (Signed)
I transported the PT downstairs via wheelchair for her pre-op chest xray. PT stable. PT to be transported back on floor by Radiology staff.

## 2016-01-29 NOTE — Transfer of Care (Signed)
Immediate Anesthesia Transfer of Care Note  Patient: Dana LoweBrenda T Clarke  Procedure(s) Performed: Procedure(s): LAPAROSCOPIC CHOLECYSTECTOMY (N/A)  Patient Location: PACU  Anesthesia Type:General  Level of Consciousness: awake, alert , oriented and patient cooperative  Airway & Oxygen Therapy: Patient Spontanous Breathing and Patient connected to nasal cannula oxygen  Post-op Assessment: Report given to RN and Post -op Vital signs reviewed and stable  Post vital signs: Reviewed and stable  Last Vitals:  Vitals:   01/29/16 1030 01/29/16 1045  BP: (!) 160/89 (!) 160/91  Pulse:    Resp: (!) 27 14  Temp:      Last Pain:  Vitals:   01/29/16 1029  TempSrc: Oral  PainSc: 3       Patients Stated Pain Goal: 2 (01/29/16 1029)  Complications: No apparent anesthesia complications

## 2016-01-29 NOTE — Anesthesia Procedure Notes (Signed)
Procedure Name: Intubation Date/Time: 01/29/2016 12:09 PM Performed by: Franco NonesYATES, Morning Halberg S Pre-anesthesia Checklist: Patient identified, Patient being monitored, Timeout performed, Emergency Drugs available and Suction available Patient Re-evaluated:Patient Re-evaluated prior to inductionOxygen Delivery Method: Circle System Utilized Preoxygenation: Pre-oxygenation with 100% oxygen Intubation Type: IV induction, Rapid sequence and Cricoid Pressure applied Ventilation: Mask ventilation without difficulty Laryngoscope Size: Miller and 2 Grade View: Grade I Tube type: Oral Tube size: 7.0 mm Number of attempts: 1 Airway Equipment and Method: Stylet Placement Confirmation: ETT inserted through vocal cords under direct vision,  positive ETCO2 and breath sounds checked- equal and bilateral Secured at: 21 cm Tube secured with: Tape Dental Injury: Teeth and Oropharynx as per pre-operative assessment

## 2016-01-29 NOTE — Progress Notes (Signed)
PROGRESS NOTE    Dana Clarke  RUE:[id number] DOB: 16-Jul-1960 DOA: 01/25/2016 PCP: Lovey Newcomer, PA    Brief Narrative:  55 yof with a history of Bell's palsy, GERD, and chronic RUQ abdominal pain, presents with complaints of sever right upper quadrant abdominal pain and nausea. Patient states she was evaluated in the Emergency Department at [facility] with abdominal ultrasound demonstrating cholelithiasis without acute cholecystitis. While in the ED imaging revealed mild cholelithiasis. Serology was unremarkable except for elevated LFTs. MRCP did now show any CBD stones, but it was felt that patient may have recently passed a stone. She was admitted for further evaluation of cholecystitis. On 11/17 patient was found to be unresponsive, after receiving IV pain medications. She was given Narcan which immediately resolved her symptoms. Plan is for cholecystectomy on 11/20 as her LFTs improve.   Assessment & Plan:   Principal Problem:   Obstructive jaundice Active Problems:   GERD (gastroesophageal reflux disease)   Abdominal pain   Cholelithiasis   Hyperbilirubinemia   Abnormal LFTs   Choledocholithiasis   Biliary obstruction   RUQ pain  1. Obstructive jaundice/Cholelithiasis. Patient reports of several years of RUQ pain. Pain has worsened significantly over the days leading up to admission and abdominal US from outside hospital from 11/15 reportedly demonstrates cholelithiasis without acute cholecystitis. It is likely that she had recently passed a stone. Transaminases have improved since admission, and bilirubin is also improving. GI and general surgery following. She will likely need cholecystectomy. Discussed with Dr. Earlene Plater and plan is for surgery on 11/20. Patient is to be kept on clear liquids for now. Continue supportive treatment. Continue to trend LFTs. Continue the patient on Rocephin.  2. Acute encephalopathy. Patient was found to be unresponsive on 11/17, after receiving  intravenous pain medications. She received a dose of Narcan which promptly resolved her symptoms. Narcotics have been discontinued. Continue on oral pain medication. Mental status is back to baseline. 3. GERD. Continue pepcid as needed,   DVT prophylaxis: Heparin  Code Status: Full  Family Communication: husband and daughter at bedside Disposition Plan: Discharge home once improved, likely in AM.    Consultants:   GI   General surgery   Procedures:   None   Antimicrobials:   Rocephin 11/17 >>    Subjective: No new complaints. Still has abdominal pain  Objective: Vitals:   01/28/16 2226 01/29/16 0244 01/29/16 0500 01/29/16 0512  BP: (!) 161/82 (!) 143/78  (!) 145/73  Pulse: (!) 58   62  Resp: 15   16  Temp: 98.4 F (36.9 C)   98.6 F (37 C)  TempSrc: Oral   Oral  SpO2: 98%   94%  Weight:   84.5 kg (186 lb 3.2 oz)   Height:        Intake/Output Summary (Last 24 hours) at 01/29/16 0802 Last data filed at 01/29/16 0600  Gross per 24 hour  Intake             1060 ml  Output             1300 ml  Net             -240 ml   Filed Weights   01/27/16 0707 01/28/16 0500 01/29/16 0500  Weight: 84.4 kg (186 lb 1.6 oz) 83.5 kg (184 lb) 84.5 kg (186 lb 3.2 oz)    Examination:  General exam: Appears calm and comfortable  Respiratory system: Clear to auscultation. Respiratory effort normal. Cardiovascular system:  S1 & S2 heard, RRR. No JVD, murmurs, rubs, gallops or clicks. No pedal edema. Gastrointestinal system: Abdomen is nondistended, soft and tender in RUQ. No organomegaly or masses felt. Normal bowel sounds heard. Central nervous system: Alert and oriented. No focal neurological deficits. Extremities: Symmetric 5 x 5 power. Skin: No rashes, lesions or ulcers Psychiatry: Judgement and insight appear normal. Mood & affect appropriate.     Data Reviewed: I have personally reviewed following labs and imaging studies  CBC:  Recent Labs Lab 01/25/16 0455  WBC  8.6  HGB 13.6  HCT 40.5  MCV 94.0  PLT 202   Basic Metabolic Panel:  Recent Labs Lab 01/25/16 0455  NA 138  K 3.7  CL 104  CO2 26  GLUCOSE 129*  BUN 12  CREATININE 0.77  CALCIUM 9.7   GFR: Estimated Creatinine Clearance: 83.5 mL/min (by C-G formula based on SCr of 0.77 mg/dL). Liver Function Tests:  Recent Labs Lab 01/25/16 0455 01/26/16 0708 01/27/16 0749 01/28/16 0724  AST 332* 79* 45* 30  ALT 413* 211* 148* 112*  ALKPHOS 190* 161* 160* 184*  BILITOT 5.5* 4.6* 2.1* 1.3*  PROT 7.4 6.5 6.6 6.7  ALBUMIN 4.1 3.4* 3.4* 3.4*    Recent Labs Lab 01/25/16 0455  LIPASE 44   No results for input(s): AMMONIA in the last 168 hours. Coagulation Profile:  Recent Labs Lab 01/25/16 0455  INR 0.90   Cardiac Enzymes: No results for input(s): CKTOTAL, CKMB, CKMBINDEX, TROPONINI in the last 168 hours. BNP (last 3 results) No results for input(s): PROBNP in the last 8760 hours. HbA1C: No results for input(s): HGBA1C in the last 72 hours. CBG: No results for input(s): GLUCAP in the last 168 hours. Lipid Profile: No results for input(s): CHOL, HDL, LDLCALC, TRIG, CHOLHDL, LDLDIRECT in the last 72 hours. Thyroid Function Tests: No results for input(s): TSH, T4TOTAL, FREET4, T3FREE, THYROIDAB in the last 72 hours. Anemia Panel: No results for input(s): VITAMINB12, FOLATE, FERRITIN, TIBC, IRON, RETICCTPCT in the last 72 hours. Sepsis Labs: No results for input(s): PROCALCITON, LATICACIDVEN in the last 168 hours.  Recent Results (from the past 240 hour(s))  Urine culture     Status: None   Collection Time: 01/25/16  3:00 PM  Result Value Ref Range Status   Specimen Description URINE, CLEAN CATCH  Final   Special Requests NONE  Final   Culture NO GROWTH Performed [facility]   Final   Report Status 01/27/2016 FINAL  Final  Surgical PCR screen     Status: Abnormal   Collection Time: 01/25/16  6:17 PM  Result Value Ref Range Status   MRSA, PCR NEGATIVE  NEGATIVE Final   Staphylococcus aureus POSITIVE (A) NEGATIVE Final    Comment:        The Xpert SA Assay (FDA approved for NASAL specimens in patients over 55 years of age), is one component of a comprehensive surveillance program.  Test performance has been validated by [facility] for patients greater than or equal to 995 year old. It is not intended to diagnose infection nor to guide or monitor treatment. RESULT CALLED TO, READ BACK BY AND VERIFIED WITH: GLENN,D ON 01/25/16 AT 2235 BY LOY,C          Radiology Studies: Chest 2 View  Result Date: 01/29/2016 CLINICAL DATA:  Preoperative examination prior to cholecystectomy today. Nonsmoker EXAM: CHEST  2 VIEW COMPARISON:  Chest x-ray of August 30, 2015 FINDINGS: The lungs are well-expanded. There is no focal  infiltrate. There is no pleural effusion. The heart and pulmonary vascularity are normal. The mediastinum is normal in width. The trachea is midline. There is calcification in the wall of the aortic arch. The bony thorax is unremarkable. IMPRESSION: There is no active cardiopulmonary disease. Thoracic aortic atherosclerosis. Electronically Signed   By: David  Swazil[place]Jordan M.D.   On: 01/29/2016 07:22        Scheduled Meds: . cefTRIAXone (ROCEPHIN)  IV  2 g Intravenous Q24H  . Chlorhexidine Gluconate Cloth  6 each Topical Daily  . docusate sodium  100 mg Oral BID  . heparin  5,000 Units Subcutaneous Q8H  . mometasone-formoterol  1 puff Inhalation BID  . mupirocin ointment  1 application Nasal BID  . zolpidem  2.5 mg Oral QHS   Continuous Infusions:   LOS: 4 days    Time spent: 25 minutes     Erick BlinksJehanzeb Memon, MD [facility] If 7PM-7AM, please contact night-coverage [email or web address] Password [email or web address] 01/29/2016, 8:02 AM

## 2016-01-29 NOTE — Op Note (Signed)
SURGICAL OPERATIVE REPORT   DATE OF PROCEDURE: 01/29/2016  ATTENDING Surgeon(s): Ancil LinseyJason Evan Zymire Turnbo, MD  ANESTHESIA: GETA  PRE-OPERATIVE DIAGNOSIS: Choledocholithiasis with early acute cholecystitis (K80.50)  POST-OPERATIVE DIAGNOSIS: Choledocholithiasis with early acute cholecystitis (K80.50)  PROCEDURE(S): (cpt's: 47562) 1.) Laparoscopic Cholecystectomy  INTRAOPERATIVE FINDINGS: Pericholecystic inflammation predominantly at the gallbladder infundibulum, neck, and ducts  INTRAOPERATIVE FLUIDS: 1100 mL crystalloid   ESTIMATED BLOOD LOSS: Minimal (<30 mL)   URINE OUTPUT: No foley  SPECIMENS: Gallbladder  IMPLANTS: None  DRAINS: None   COMPLICATIONS: None apparent   CONDITION AT COMPLETION: Hemodynamically stable and extubated  DISPOSITION: PACU   INDICATION(S) FOR PROCEDURE:  Patient is a 55 y.o. female with chronic RUQ abdominal pain x 8 years without obvious cholelithiasis [facility] the day prior to admission and was diagnosed with cholelithiasis and then returned [facility] the following day with worsening RUQ pain and a T. Bilirubin of 5.5 without substantial ductal dilation. MRCP was performed, which did not demonstrate any ductal filling defect, and T. Bilirubin decreased over the next few days to 1.2 this morning with corresponding improvement to patient's abdominal pain. All risks, benefits, and alternatives to above elective procedures were discussed with the patient, who elected to proceed, and informed consent was accordingly obtained at that time.   DETAILS OF PROCEDURE:  Patient was brought to the operating suite and appropriately identified. General anesthesia was administered along with peri-operative prophylactic IV antibiotics, and endotracheal intubation was performed by anesthesiologist, along with NG/OG tube for gastric decompression. In supine position, operative site was prepped and draped in usual sterile  fashion, and following a brief time out, initial 5 mm incision was made in a natural skin crease just above the umbilicus. Fascia was then elevated, and a Verress needle was inserted and its proper position confirmed using aspiration and saline meniscus test.  Upon insufflation of the abdominal cavity with carbon dioxide to a well-tolerated pressure of 12-15 mmHg, 5 mm peri-umbilical port followed by laparoscope were inserted and used to inspect the abdominal cavity and its contents with no injuries from insertion of the first trochar noted. Three additional trocars were inserted, one at the epigastric position (10 mm) and two along the Right costal margin (5 mm). The table was then placed in reverse Trendelenburg position with the Right side up. Filmy adhesions between the gallbladder and omentum/duodenum/transverse colon were lysed using combined blunt and sharp dissection. The apex/dome of the gallbladder was grasped with an atraumatic grasper passed through the lateral port and retracted apically over the liver. The infundibulum was also grasped and retracted, exposing Calot's triangle. The peritoneum overlying the gallbladder infundibulum was incised and dissected free of surrounding peritoneal attachments, revealing the cystic duct and cystic artery, which were clipped twice on the patient side and once on the gallbladder specimen side close to the gallbladder. Of note, the cystic duct was observed to be a short structure < 1 cm from a pulsating arterial bifurcation with the other branch appearing to continue in the direction of the liver (likely Right hepatic artery), and only the cystic duct was clipped and divided on the gallbladder specimen side. The tissue between the gallbladder and these arterial structures was carefully dissected, and the other branch was confirmed to enter only the liver and did not communicate with the gallbladder. The gallbladder was then dissected from its peritoneal attachments  to the liver using electrocautery, and the gallbladder was placed into a laparoscopic specimen bag and removed from the abdominal  cavity via the epigastric port site. Hemostasis and secure placement of clips were confirmed, and intra-peritoneal cavity was inspected with no additional findings. Endoclose laparoscopic fascial closure device was then used to re-approximate fascia at the 10 mm epigastric port site.  All ports were then removed under direct visualization, and abdominal cavity was desuflated. All port sites were irrigated/cleaned, additional local anesthetic was injected at each incision, 3-0 Vicryl was used to re-approximate dermis at 10 mm port site(s), and subcuticular 4-0 Monocryl suture was used to re-approximate skin. Skin was then cleaned, dried, and sterile skin glue was applied. Patient was then safely able to be awakened, extubated, and transferred to PACU for post-operative monitoring and care.   I was present for all aspects of procedure, and there were no intra-operative complications apparent.

## 2016-01-29 NOTE — Anesthesia Postprocedure Evaluation (Signed)
Anesthesia Post Note  Patient: Dana Clarke  Procedure(s) Performed: Procedure(s) (LRB): LAPAROSCOPIC CHOLECYSTECTOMY (N/A)  Patient location during evaluation: PACU Anesthesia Type: General Level of consciousness: awake and alert and oriented Pain management: pain level controlled Vital Signs Assessment: post-procedure vital signs reviewed and stable Respiratory status: spontaneous breathing and patient connected to nasal cannula oxygen Cardiovascular status: stable : Nausea resolved with zofran. Anesthetic complications: no    Last Vitals:  Vitals:   01/29/16 1334 01/29/16 1358  BP: (!) 142/74 131/73  Pulse: 71 65  Resp: (!) 24 (!) 4  Temp: 37.1 C     Last Pain:  Vitals:   01/29/16 1400  TempSrc:   PainSc: 5                  ADAMS, AMY A

## 2016-01-29 NOTE — Anesthesia Preprocedure Evaluation (Signed)
Anesthesia Evaluation  Patient identified by MRN, date of birth, ID band Patient awake    Reviewed: Allergy & Precautions, NPO status , Patient's Chart, lab work & pertinent test results  Airway Mallampati: II  TM Distance: >3 FB     Dental  (+) Teeth Intact   Pulmonary    breath sounds clear to auscultation       Cardiovascular negative cardio ROS   Rhythm:Regular Rate:Normal     Neuro/Psych  Neuromuscular disease (Bell's Palsy)    GI/Hepatic GERD  ,  Endo/Other    Renal/GU      Musculoskeletal   Abdominal   Peds  Hematology   Anesthesia Other Findings   Reproductive/Obstetrics                             Anesthesia Physical Anesthesia Plan  ASA: II  Anesthesia Plan: General   Post-op Pain Management:    Induction: Intravenous  Airway Management Planned: Oral ETT  Additional Equipment:   Intra-op Plan:   Post-operative Plan: Extubation in OR  Informed Consent: I have reviewed the patients History and Physical, chart, labs and discussed the procedure including the risks, benefits and alternatives for the proposed anesthesia with the patient or authorized representative who has indicated his/her understanding and acceptance.     Plan Discussed with:   Anesthesia Plan Comments:         Anesthesia Quick Evaluation

## 2016-01-30 LAB — HEPATIC FUNCTION PANEL
ALT: 114 U/L — ABNORMAL HIGH (ref 14–54)
AST: 73 U/L — ABNORMAL HIGH (ref 15–41)
Albumin: 3.5 g/dL (ref 3.5–5.0)
Alkaline Phosphatase: 152 U/L — ABNORMAL HIGH (ref 38–126)
BILIRUBIN DIRECT: 0.4 mg/dL (ref 0.1–0.5)
BILIRUBIN INDIRECT: 0.4 mg/dL (ref 0.3–0.9)
BILIRUBIN TOTAL: 0.8 mg/dL (ref 0.3–1.2)
Total Protein: 6.8 g/dL (ref 6.5–8.1)

## 2016-01-30 MED ORDER — TRAMADOL-ACETAMINOPHEN 37.5-325 MG PO TABS: 1.0000 | ORAL_TABLET | Freq: Four times a day (QID) | ORAL | 0 refills | Status: DC | PRN

## 2016-01-30 NOTE — Addendum Note (Signed)
Addendum  created 01/30/16 1426 by Despina Hiddenobert J Tyese Finken, CRNA   Sign clinical note

## 2016-01-30 NOTE — Progress Notes (Signed)
[facility] Day(s): 5.   Post op day(s): 1 Day Post-Op.   Interval History: Patient seen and examined, no acute events or new complaints overnight. Patient reports pain has much improved since yesterday evening, and she denies N/V, fever/chills, CP, or SOB and has been tolerating PO and ambulation.  Review of Systems:  Constitutional: denies fever, chills  HEENT: denies cough or congestion  Respiratory: denies any shortness of breath  Cardiovascular: denies chest pain or palpitations  Gastrointestinal: abdominal pain, N/V, and bowel function as per HPI Genitourinary: denies burning with urination or urinary frequency Musculoskeletal: denies pain, decreased motor or sensation Integumentary: denies any other rashes or skin discolorations Neurological: denies HA or vision/hearing changes   Vital signs in last 24 hours: [min-max] current  Temp:  [97.6 F (36.4 C)-98.8 F (37.1 C)] 97.9 F (36.6 C) (11/21 0551) Pulse Rate:  [51-71] 57 (11/21 0551) Resp:  [3-31] 15 (11/21 0551) BP: (102-160)/(55-91) 102/55 (11/21 0551) SpO2:  [90 %-100 %] 95 % (11/21 0551) Weight:  [84.1 kg (185 lb 6.5 oz)-84.4 kg (186 lb)] 84.1 kg (185 lb 6.5 oz) (11/21 0500)     Height: '5\' 4"'$  (162.6 cm) Weight: 84.1 kg (185 lb 6.5 oz) BMI (Calculated): 32   Intake/Output this shift:  Total I/O In: 290 [P.O.:240; IV Piggyback:50] Out: -    Intake/Output last 2 shifts:  '@IOLAST2SHIFTS'$ @   Physical Exam:  Constitutional: alert, cooperative and no distress  HENT: normocephalic without obvious abnormality  Eyes: PERRL, EOM's grossly intact and symmetric  Neuro: CN II - XII grossly intact and symmetric without deficit  Respiratory: breathing non-labored at rest  Cardiovascular: regular rate and sinus rhythm  Gastrointestinal: soft, moderate epigastric tenderness to palpation, non-distended, and incisions well-approximated without erythema or drainage  Musculoskeletal: UE and LE FROM, no  edema or wounds, motor and sensation grossly intact, NT   Labs:  Hepatic Function Latest Ref Rng & Units 01/30/2016 01/29/2016 01/28/2016  Total Protein 6.5 - 8.1 g/dL 6.8 7.3 6.7  Albumin 3.5 - 5.0 g/dL 3.5 3.7 3.4(L)  AST 15 - 41 U/L 73(H) 53(H) 30  ALT 14 - 54 U/L 114(H) 107(H) 112(H)  Alk Phosphatase 38 - 126 U/L 152(H) 179(H) 184(H)  Total Bilirubin 0.3 - 1.2 mg/dL 0.8 1.2 1.3(H)  Bilirubin, Direct 0.1 - 0.5 mg/dL 0.4 0.5 0.6(H)    Imaging studies: No new pertinent imaging studies   Assessment/Plan: (ICD-10's: K80.50) 55 y.o.femalewith doing well 1 Day Post-Op s/p laparoscopic cholecystectomy for resolving choledocholithiasis with early mild cholecystitis, complicated by pertinent comorbidities including chronic abdominal pain, pan-colonic diverticulosis without bleeding or diverticulitis, GERD, and Bell's palsy.  - pain control prn - regular diet as tolerated (discussed with patient and family)  - follow-up outpatient in 2 weeks, discharge instructions added to chart  - medical management as per primary medical team - ambulation encouraged, DVT prophylaxis  All of the above findings and recommendations were discussed with the patient,her family, and patient's medical physician, and all of patient's and her family's questions were answered to their expressed satisfaction.  Thank you for the opportunity to participate in this patient's care.   -- Marilynne Drivers Rosana Hoes, MD, [place]: [facility] Office: [phone]

## 2016-01-30 NOTE — Progress Notes (Signed)
Dana LoweBrenda T Clarke discharged Home per MD order.  Discharge instructions reviewed and discussed with the patient, all questions and concerns answered. Copy of instructions and scripts given to patient.    Medication List    STOP taking these medications   topiramate 25 MG tablet Commonly known as:  TOPAMAX     TAKE these medications   acetaminophen 500 MG tablet Commonly known as:  TYLENOL Take 1,000 mg by mouth every 6 (six) hours as needed for moderate pain.   ALEVE PM 220-25 MG Tabs Generic drug:  Naproxen Sod-Diphenhydramine Take 1 tablet by mouth daily as needed (sleep).   Fluticasone-Salmeterol 250-50 MCG/DOSE Aepb Commonly known as:  ADVAIR Inhale 1 puff into the lungs 2 (two) times daily.   gabapentin 100 MG capsule Commonly known as:  NEURONTIN Take 3 capsules (300 mg total) by mouth 3 (three) times daily. Start 1 capsule twice daily x 1 week then three times daily   naproxen sodium 220 MG tablet Commonly known as:  ANAPROX Take 220-440 mg by mouth daily as needed (pain).   traMADol-acetaminophen 37.5-325 MG tablet Commonly known as:  ULTRACET Take 1-2 tablets by mouth every 6 (six) hours as needed for severe pain.       Patient escorted to car by NT in a wheelchair,  no distress noted upon discharge.  [name] Boardley 01/30/2016 4:26 PM

## 2016-01-30 NOTE — Anesthesia Postprocedure Evaluation (Signed)
Anesthesia Post Note  Patient: Dana LoweBrenda T Clarke  Procedure(s) Performed: Procedure(s) (LRB): LAPAROSCOPIC CHOLECYSTECTOMY (N/A)  Patient location during evaluation: Nursing Unit Anesthesia Type: General Level of consciousness: awake, awake and alert and oriented Pain management: pain level controlled Vital Signs Assessment: post-procedure vital signs reviewed and stable Respiratory status: spontaneous breathing, nonlabored ventilation and respiratory function stable Cardiovascular status: blood pressure returned to baseline and stable Postop Assessment: no signs of nausea or vomiting and adequate PO intake Anesthetic complications: no    Last Vitals:  Vitals:   01/30/16 0551 01/30/16 1350  BP: (!) 102/55 (!) 110/52  Pulse: (!) 57 (!) 57  Resp: 15 20  Temp: 36.6 C 36.6 C    Last Pain:  Vitals:   01/30/16 1350  TempSrc: Oral  PainSc:                  Uriah Philipson J

## 2016-01-30 NOTE — Discharge Summary (Signed)
Physician Discharge Summary  Dana LoweBrenda T Clarke WJX:[id number] DOB: 02/14/1961 DOA: 01/25/2016  PCP: Lovey NewcomerBOYD, WILLIAM S, PA  Admit date: 01/25/2016 Discharge date: 01/30/2016  Admitted From: Home  Disposition: Home   Recommendations for Outpatient Follow-up:  1. Follow up with general surgery in 2 weeks  Home Health: No  Equipment/Devices: None   Discharge Condition: Stable  CODE STATUS: FULL  Diet recommendation: Heart healthy   Brief/Interim Summary: [age] yof with a history of Bell's palsy, GERD, and chronic RUQ abdominal pain, presents with complaints of sever right upper quadrant abdominal pain and nausea. Patient states she was [facility] ultrasound demonstrating cholelithiasis without acute cholecystitis. While in the ED imaging revealed mild cholelithiasis. Serology was unremarkable except for her elevated LFTs. MRCP did not show any CBD stones, but it was felt that the patient may have recently passed a stone. She was admitted for further evaluation of cholecystitis. Patient had a cholecystectomy on 11/20 without any immediate complications. Patient has overall improved and is ready for discharge home.    Discharge Diagnoses:  Principal Problem:   Obstructive jaundice Active Problems:   GERD (gastroesophageal reflux disease)   Abdominal pain   Cholelithiasis   Hyperbilirubinemia   Abnormal LFTs   Choledocholithiasis   Biliary obstruction   RUQ pain  1. Obstructive jaundice/Cholecystitis. Patient reported of several years RUQ pain. Pain worsened significantly over the days leading up to admission and abdominal US from outside hospital from 11/15 reportedly demonstrated cholelithiasis without acute cholecystitis. It was likely that she had recently passed a stone. LFTs were elevated on admission, but improved during hospitalization. GI and general surgery were consulted and followed during hospitalization. She underwent  cholecystectomy on 11/20 without any immediate complications. She is tolerating a solid diet. She will follow up with general surgery in 2 weeks. 2. Acute encephalopathy. Patient was found to be unresponsive on 11/17, after receiving intravenous pain medications. She received a dose of Narcan which promptly resolved her symptoms. Narcotics were discontinued and her mental status is back to baseline. 3. GERD. Continue pepcid as needed as an outpatient.   4. History of paraesthesias. Followed by neurology as outpatient. Has been taking gabapentin which will be continued. Was prescribed topamax by Dr. Pearlean BrownieSethi, but reports has not been taking it, since she feels its too sedating. She has been advised to follow up with neurology to discuss this further.  Discharge Instructions  Discharge Instructions    Diet - low sodium heart healthy    Complete by:  As directed    Increase activity slowly    Complete by:  As directed        Medication List    STOP taking these medications   topiramate 25 MG tablet Commonly known as:  TOPAMAX     TAKE these medications   acetaminophen 500 MG tablet Commonly known as:  TYLENOL Take 1,000 mg by mouth every 6 (six) hours as needed for moderate pain.   ALEVE PM 220-25 MG Tabs Generic drug:  Naproxen Sod-Diphenhydramine Take 1 tablet by mouth daily as needed (sleep).   Fluticasone-Salmeterol 250-50 MCG/DOSE Aepb Commonly known as:  ADVAIR Inhale 1 puff into the lungs 2 (two) times daily.   gabapentin 100 MG capsule Commonly known as:  NEURONTIN Take 3 capsules (300 mg total) by mouth 3 (three) times daily. Start 1 capsule twice daily x 1 week then three times daily   naproxen sodium 220 MG tablet Commonly known as:  ANAPROX  Take 220-440 mg by mouth daily as needed (pain).   traMADol-acetaminophen 37.5-325 MG tablet Commonly known as:  ULTRACET Take 1-2 tablets by mouth every 6 (six) hours as needed for severe pain.      Follow-up Information     Ancil Linsey, MD. Schedule an appointment as soon as possible for a visit in 2 week(s).   Specialty:  General Surgery Contact information: [address] [facility] [id number] [phone]          Allergies  Allergen Reactions  . Codeine Itching  . Dilaudid [Hydromorphone Hcl] Other (See Comments)    Caused itching and the patient had a reaction that required her to require Narcan.  Patient stated that she does not want this medication again.    Consultations:  GI  General surgery   Procedures/Studies: Chest 2 View  Result Date: 01/29/2016 CLINICAL DATA:  Preoperative examination prior to cholecystectomy today. Nonsmoker EXAM: CHEST  2 VIEW COMPARISON:  Chest x-ray of August 30, 2015 FINDINGS: The lungs are well-expanded. There is no focal infiltrate. There is no pleural effusion. The heart and pulmonary vascularity are normal. The mediastinum is normal in width. The trachea is midline. There is calcification in the wall of the aortic arch. The bony thorax is unremarkable. IMPRESSION: There is no active cardiopulmonary disease. Thoracic aortic atherosclerosis. Electronically Signed   By: David  [place] M.D.   On: 01/29/2016 07:22   Mr 3d Recon At Scanner  Result Date: 01/25/2016 CLINICAL DATA:  Right upper quadrant pain x2 weeks, abnormal LFTs EXAM: MRI ABDOMEN WITHOUT AND WITH CONTRAST (INCLUDING MRCP) TECHNIQUE: Multiplanar multisequence MR imaging of the abdomen was performed both before and after the administration of intravenous contrast. Heavily T2-weighted images of the biliary and pancreatic ducts were obtained, and three-dimensional MRCP images were rendered by post processing. CONTRAST:  15mL MULTIHANCE GADOBENATE DIMEGLUMINE 529 MG/ML IV SOLN COMPARISON:  Abdominal ultrasound dated 01/24/2016 FINDINGS: Severely motion degraded images. Lower chest: Lung bases are essentially clear. Hepatobiliary: 12 mm cyst in the medial segment left hepatic dome (series 4/  image 11). No hepatic steatosis. No suspicious/enhancing hepatic lesions. Suspected tiny layering gallstones (series 4/ image 35). No definite gallbladder wall thickening. Trace pericholecystic fluid. Mild periportal edema. No intrahepatic ductal dilatation. Mildly prominent common duct, measuring 7 mm, within the upper limits of normal. No choledocholithiasis is seen. Pancreas:  Within normal limits. Spleen:  Within normal limits. Adrenals/Urinary Tract:  Adrenal glands are within normal limits. Small bilateral renal cysts, measuring up to 1.6 cm in the posterior right upper kidney (series 4/image 28). No hydronephrosis. Stomach/Bowel: Stomach is within normal limits. Visualized bowel is unremarkable. Vascular/Lymphatic:  No evidence of abdominal aortic aneurysm. No suspicious abdominal lymphadenopathy. Other:  No abdominal ascites. Musculoskeletal: No focal osseous lesions. IMPRESSION: Motion degraded images, limiting evaluation. Mild cholelithiasis. Pericholecystic fluid/inflammatory changes, progressed from recent ultrasound, but without gallbladder wall thickening. Mild periportal edema. Mildly prominent common duct, measuring 7 mm. No choledocholithiasis is seen. Overall appearance favors a right upper quadrant inflammatory process such as hepatitis or early acute cholecystitis, recent passage of a common duct stone, or a nonvisualized distal CBD stone or stricture. If there is clinical concern for acute cholecystitis, consider hepatobiliary nuclear medicine scan. Otherwise, consider ERCP to assess for nonvisualized common duct stone/stricture. Electronically Signed   By: Charline Bills M.D.   On: 01/25/2016 12:20   Mr Abdomen Mrcp Vivien Rossetti Contast  Result Date: 01/25/2016 CLINICAL DATA:  Right upper quadrant pain x2 weeks,  abnormal LFTs EXAM: MRI ABDOMEN WITHOUT AND WITH CONTRAST (INCLUDING MRCP) TECHNIQUE: Multiplanar multisequence MR imaging of the abdomen was performed both before and after the  administration of intravenous contrast. Heavily T2-weighted images of the biliary and pancreatic ducts were obtained, and three-dimensional MRCP images were rendered by post processing. CONTRAST:  15mL MULTIHANCE GADOBENATE DIMEGLUMINE 529 MG/ML IV SOLN COMPARISON:  Abdominal ultrasound dated 01/24/2016 FINDINGS: Severely motion degraded images. Lower chest: Lung bases are essentially clear. Hepatobiliary: 12 mm cyst in the medial segment left hepatic dome (series 4/ image 11). No hepatic steatosis. No suspicious/enhancing hepatic lesions. Suspected tiny layering gallstones (series 4/ image 35). No definite gallbladder wall thickening. Trace pericholecystic fluid. Mild periportal edema. No intrahepatic ductal dilatation. Mildly prominent common duct, measuring 7 mm, within the upper limits of normal. No choledocholithiasis is seen. Pancreas:  Within normal limits. Spleen:  Within normal limits. Adrenals/Urinary Tract:  Adrenal glands are within normal limits. Small bilateral renal cysts, measuring up to 1.6 cm in the posterior right upper kidney (series 4/image 28). No hydronephrosis. Stomach/Bowel: Stomach is within normal limits. Visualized bowel is unremarkable. Vascular/Lymphatic:  No evidence of abdominal aortic aneurysm. No suspicious abdominal lymphadenopathy. Other:  No abdominal ascites. Musculoskeletal: No focal osseous lesions. IMPRESSION: Motion degraded images, limiting evaluation. Mild cholelithiasis. Pericholecystic fluid/inflammatory changes, progressed from recent ultrasound, but without gallbladder wall thickening. Mild periportal edema. Mildly prominent common duct, measuring 7 mm. No choledocholithiasis is seen. Overall appearance favors a right upper quadrant inflammatory process such as hepatitis or early acute cholecystitis, recent passage of a common duct stone, or a nonvisualized distal CBD stone or stricture. If there is clinical concern for acute cholecystitis, consider hepatobiliary  nuclear medicine scan. Otherwise, consider ERCP to assess for nonvisualized common duct stone/stricture. Electronically Signed   By: Charline Bills M.D.   On: 01/25/2016 12:20     Antimicrobials:   Rocephin 11/17 >>    Discharge Exam: Vitals:   01/30/16 0551 01/30/16 1350  BP: (!) 102/55 (!) 110/52  Pulse: (!) 57 (!) 57  Resp: 15 20  Temp: 97.9 F (36.6 C) 97.9 F (36.6 C)   Vitals:   01/29/16 2146 01/30/16 0500 01/30/16 0551 01/30/16 1350  BP: (!) 135/58  (!) 102/55 (!) 110/52  Pulse: 65  (!) 57 (!) 57  Resp: 16  15 20   Temp: 97.6 F (36.4 C)  97.9 F (36.6 C) 97.9 F (36.6 C)  TempSrc: Oral  Oral Oral  SpO2: 95%  95% 100%  Weight:  84.1 kg (185 lb 6.5 oz)    Height:        General: Pt is alert, awake, not in acute distress Cardiovascular: RRR, S1/S2 +, no rubs, no gallops Respiratory: CTA bilaterally, no wheezing, no rhonchi Abdominal: Soft, NT, ND, bowel sounds + Extremities: no edema, no cyanosis    The results of significant diagnostics from this hospitalization (including imaging, microbiology, ancillary and laboratory) are listed below for reference.     Microbiology: Recent Results (from the past 240 hour(s))  Urine culture     Status: None   Collection Time: 01/25/16  3:00 PM  Result Value Ref Range Status   Specimen Description URINE, CLEAN CATCH  Final   Special Requests NONE  Final   Culture NO GROWTH Performed at [facility]   Final   Report Status 01/27/2016 FINAL  Final  Surgical PCR screen     Status: Abnormal   Collection Time: 01/25/16  6:17 PM  Result Value Ref Range Status  MRSA, PCR NEGATIVE NEGATIVE Final   Staphylococcus aureus POSITIVE (A) NEGATIVE Final    Comment:        The Xpert SA Assay (FDA approved for NASAL specimens in patients over 55 years of age), is one component of a comprehensive surveillance program.  Test performance has been validated by [facility] for patients greater than or equal to 1 year  old. It is not intended to diagnose infection nor to guide or monitor treatment. RESULT CALLED TO, READ BACK BY AND VERIFIED WITH: GLENN,D ON 01/25/16 AT 2235 BY LOY,C      Labs: BNP (last 3 results) No results for input(s): BNP in the last 8760 hours. Basic Metabolic Panel:  Recent Labs Lab 01/25/16 0455  NA 138  K 3.7  CL 104  CO2 26  GLUCOSE 129*  BUN 12  CREATININE 0.77  CALCIUM 9.7   Liver Function Tests:  Recent Labs Lab 01/26/16 0708 01/27/16 0749 01/28/16 0724 01/29/16 0930 01/30/16 0518  AST 79* 45* 30 53* 73*  ALT 211* 148* 112* 107* 114*  ALKPHOS 161* 160* 184* 179* 152*  BILITOT 4.6* 2.1* 1.3* 1.2 0.8  PROT 6.5 6.6 6.7 7.3 6.8  ALBUMIN 3.4* 3.4* 3.4* 3.7 3.5    Recent Labs Lab 01/25/16 0455  LIPASE 44   No results for input(s): AMMONIA in the last 168 hours. CBC:  Recent Labs Lab 01/25/16 0455  WBC 8.6  HGB 13.6  HCT 40.5  MCV 94.0  PLT 202   Cardiac Enzymes: No results for input(s): CKTOTAL, CKMB, CKMBINDEX, TROPONINI in the last 168 hours. BNP: Invalid input(s): POCBNP CBG: No results for input(s): GLUCAP in the last 168 hours. D-Dimer No results for input(s): DDIMER in the last 72 hours. Hgb A1c No results for input(s): HGBA1C in the last 72 hours. Lipid Profile No results for input(s): CHOL, HDL, LDLCALC, TRIG, CHOLHDL, LDLDIRECT in the last 72 hours. Thyroid function studies No results for input(s): TSH, T4TOTAL, T3FREE, THYROIDAB in the last 72 hours.  Invalid input(s): FREET3 Anemia work up No results for input(s): VITAMINB12, FOLATE, FERRITIN, TIBC, IRON, RETICCTPCT in the last 72 hours. Urinalysis    Component Value Date/Time   COLORURINE AMBER (A) 01/25/2016 1500   APPEARANCEUR CLEAR 01/25/2016 1500   LABSPEC 1.020 01/25/2016 1500   PHURINE 6.0 01/25/2016 1500   GLUCOSEU 100 (A) 01/25/2016 1500   HGBUR NEGATIVE 01/25/2016 1500   BILIRUBINUR LARGE (A) 01/25/2016 1500   KETONESUR NEGATIVE 01/25/2016 1500    PROTEINUR NEGATIVE 01/25/2016 1500   UROBILINOGEN 0.2 05/06/2013 1128   NITRITE NEGATIVE 01/25/2016 1500   LEUKOCYTESUR NEGATIVE 01/25/2016 1500   Sepsis Labs Invalid input(s): PROCALCITONIN,  WBC,  LACTICIDVEN Microbiology Recent Results (from the past 240 hour(s))  Urine culture     Status: None   Collection Time: 01/25/16  3:00 PM  Result Value Ref Range Status   Specimen Description URINE, CLEAN CATCH  Final   Special Requests NONE  Final   Culture NO GROWTH Performed at [facility]   Final   Report Status 01/27/2016 FINAL  Final  Surgical PCR screen     Status: Abnormal   Collection Time: 01/25/16  6:17 PM  Result Value Ref Range Status   MRSA, PCR NEGATIVE NEGATIVE Final   Staphylococcus aureus POSITIVE (A) NEGATIVE Final    Comment:        The Xpert SA Assay (FDA approved for NASAL specimens in patients over 55 years of age), is one component of a comprehensive  surveillance program.  Test performance has been validated by [facility] for patients greater than or equal to 74 year old. It is not intended to diagnose infection nor to guide or monitor treatment. RESULT CALLED TO, READ BACK BY AND VERIFIED WITH: GLENN,D ON 01/25/16 AT 2235 BY LOY,C      Time coordinating discharge: Over 30 minutes  SIGNED:   Erick Blinks, MD  [facility] 01/30/2016, 3:31 PM If 7PM-7AM, please contact night-coverage [email or web address] Password [email or web address]

## 2016-01-30 NOTE — Care Management Note (Signed)
Case Management Note  Patient Details  Name: Dana Clarke MRN: [id number] Date of Birth: 06/17/1960   If discussed at Long Length of Stay Meetings, dates discussed:  01/30/2016  Additional Comments:  Ezzie Senat, Chrystine OilerSharley Diane, RN 01/30/2016, 2:57 PM

## 2016-01-31 ENCOUNTER — Ambulatory Visit: Payer: Federal, State, Local not specified - PPO | Admitting: Neurology

## 2016-01-31 ENCOUNTER — Encounter (HOSPITAL_COMMUNITY): Payer: Self-pay | Admitting: Surgery

## 2016-03-06 ENCOUNTER — Ambulatory Visit (HOSPITAL_COMMUNITY)
Admission: RE | Admit: 2016-03-06 | Discharge: 2016-03-06 | Disposition: A | Discharge status: Home / Self Care | Payer: Federal, State, Local not specified - PPO | Source: Ambulatory Visit | Attending: Neurology | Admitting: Neurology

## 2016-03-06 DIAGNOSIS — M5413 Radiculopathy, cervicothoracic region: Secondary | ICD-10-CM | POA: Diagnosis not present

## 2016-03-06 DIAGNOSIS — M2578 Osteophyte, vertebrae: Secondary | ICD-10-CM | POA: Diagnosis not present

## 2016-03-06 DIAGNOSIS — M50223 Other cervical disc displacement at C6-C7 level: Secondary | ICD-10-CM | POA: Diagnosis not present

## 2016-03-06 DIAGNOSIS — R200 Anesthesia of skin: Secondary | ICD-10-CM | POA: Diagnosis not present

## 2016-03-06 DIAGNOSIS — M47812 Spondylosis without myelopathy or radiculopathy, cervical region: Secondary | ICD-10-CM | POA: Diagnosis not present

## 2016-04-11 DIAGNOSIS — Z6836 Body mass index (BMI) 36.0-36.9, adult: Secondary | ICD-10-CM | POA: Diagnosis not present

## 2016-04-11 DIAGNOSIS — R1011 Right upper quadrant pain: Secondary | ICD-10-CM | POA: Diagnosis not present

## 2016-04-12 ENCOUNTER — Encounter: Payer: Self-pay | Admitting: Internal Medicine

## 2016-04-29 ENCOUNTER — Ambulatory Visit (INDEPENDENT_AMBULATORY_CARE_PROVIDER_SITE_OTHER): Payer: Federal, State, Local not specified - PPO | Admitting: Nurse Practitioner

## 2016-04-29 ENCOUNTER — Encounter: Payer: Self-pay | Admitting: Nurse Practitioner

## 2016-04-29 VITALS — BP 111/65 | HR 67 | Temp 98.2°F | Ht 65.0 in | Wt 189.6 lb

## 2016-04-29 DIAGNOSIS — R7989 Other specified abnormal findings of blood chemistry: Secondary | ICD-10-CM

## 2016-04-29 DIAGNOSIS — R1011 Right upper quadrant pain: Secondary | ICD-10-CM | POA: Diagnosis not present

## 2016-04-29 DIAGNOSIS — R112 Nausea with vomiting, unspecified: Secondary | ICD-10-CM | POA: Insufficient documentation

## 2016-04-29 DIAGNOSIS — R945 Abnormal results of liver function studies: Secondary | ICD-10-CM

## 2016-04-29 NOTE — Assessment & Plan Note (Signed)
Last LFTs provided by primary care approximately 2 weeks ago showed normal AST/ALT, bilirubin, alkaline phosphatase. Also normal lipase. LFTs were elevated in the hospital associated with likely passed stone and choledocholithiasis. She is now status post cholecystectomy. Given normal LFTs, no indication for repeat MRCP or ERCP. Further imaging as per below.

## 2016-04-29 NOTE — Patient Instructions (Signed)
Pt unable to schedule US abdomen while in office at [facility] d/t her work schedule. Pt works at [employer]. Advised pt to call our office when she is ready to have US done.

## 2016-04-29 NOTE — Progress Notes (Signed)
CC'D TO PCP °

## 2016-04-29 NOTE — Progress Notes (Signed)
Referring Provider: Lovey Newcomer, PA Primary Care Physician:  Lovey Newcomer, PA Primary GI:  Dr. Jena Gauss  Chief Complaint  Patient presents with  . Abdominal Pain  . Nausea    HPI:   Dana Clarke is a 56 y.o. female who presents On referral from primary care for abdominal pain, nausea, vomiting. PCP notes reviewed, last ER note reviewed, ultrasound imaging report reviewed, labs reviewed. The patient was seen in the emergency department 01/25/2016 for right upper quadrant pain. Just before then has been evaluated at [facility] with ultrasound image treating cholelithiasis without cholecystitis. In the emergency department imaging revealed mild cholelithiasis, labs showed elevated LFTs but MRCP without any common bile duct stones and it was felt that the patient likely recently passed a stone. She underwent laparoscopic cholecystectomy on 01/29/2016 without complications. Most recently saw primary care on 04/11/2016 for persistent right upper quadrant pain since cholecystectomy by Dr. Earlene Plater and surgery, some nausea and vomiting. Felt she given enough time for surgical recovery and wanted further evaluation. Labs drawn that day noted normal LFTs, no leukocytosis, no anemia, lipase normal. Her primary care query didn't need for repeat imaging such as MRCP or ERCP.  Today she states she is having continued, but improved, symptoms. Pain is RUQ typically sharp or dull, occurs about once a day but not as strong as it was. Did have N/V previously when symptoms were worse, but not currently with N/V. Denies hematochezia, melena, yellowing of skin/eyes. Has a bowel movement about 1-2 times a day. Occasionally constipated and takes stool softeners otherwise consistent with [place] 4 and no straining. Denies chest pain, dyspnea, dizziness, lightheadedness, syncope, near syncope. Denies any other upper or lower GI symptoms.  Past Medical History:  Diagnosis Date  . Abdominal pain    chronic  right sided abd pain, gallbladder w/u negative in 2009 (u/s and HIDA), repeat u/s negative in 2014, 03/2015  . Bell's palsy   . Diverticula of colon   . GERD (gastroesophageal reflux disease)   . Rectocele   . Seasonal allergies   . Stress     Past Surgical History:  Procedure Laterality Date  . CHOLECYSTECTOMY N/A 01/29/2016   Procedure: LAPAROSCOPIC CHOLECYSTECTOMY;  Surgeon: Ancil Linsey, MD;  Location: [facility];  Service: General;  Laterality: N/A;  . COLONOSCOPY  01/2008   [id number] anal canal, pancolonic diverticula, normal terminal ileium  . ESOPHAGOGASTRODUODENOSCOPY  01/11/2008    [id number] esophagus, small hiatal hernia, tiny antral erosions.  Otherwise unremarkable gastric mucosa, patent pylorus, normal D1 and D2.   Marland Kitchen SIGMOIDOSCOPY  02/12/96   interenal hemorrhoids  . TONSILLECTOMY      Current Outpatient Prescriptions  Medication Sig Dispense Refill  . acetaminophen (TYLENOL) 500 MG tablet Take 1,000 mg by mouth every 6 (six) hours as needed for moderate pain.    . celecoxib (CELEBREX) 200 MG capsule Take 200 mg by mouth 2 (two) times daily.    . Naproxen Sod-Diphenhydramine (ALEVE PM) 220-25 MG TABS Take 1 tablet by mouth daily as needed (sleep).    . Probiotic Product (PROBIOTIC DAILY PO) Take by mouth daily.    Marland Kitchen zolpidem (AMBIEN) 10 MG tablet Take 5 mg by mouth at bedtime as needed.      No current facility-administered medications for this visit.     Allergies as of 04/29/2016 - Review Complete 04/29/2016  Allergen Reaction Noted  . Codeine Itching 08/20/2011  . Dilaudid [hydromorphone hcl] Other (See Comments) 01/26/2016  Family History  Problem Relation Age of Onset  . COPD Father     deceased age 56  . Heart disease Mother     deceased age 6870s  . Diabetes Mother   . Cirrhosis Brother     etoh  . Bell's palsy Brother   . Stroke Brother   . Breast cancer Maternal Aunt     2 maternal great aunt.  . Breast cancer Paternal Aunt   . Colon cancer  Neg Hx     Social History   Social History  . Marital status: Divorced    Spouse name: N/A  . Number of children: 3  . Years of education: N/A   Occupational History  .  [employer]   Social History Main Topics  . Smoking status: Never Smoker  . Smokeless tobacco: Never Used  . Alcohol use No  . Drug use: No  . Sexual activity: Not Asked   Other Topics Concern  . None   Social History Narrative  . None    Review of Systems: General: Negative for anorexia, weight loss, fever, chills, fatigue, weakness. ENT: Negative for hoarseness, difficulty swallowing. CV: Negative for chest pain, angina, palpitations, peripheral edema.  Respiratory: Negative for dyspnea at rest, cough, sputum, wheezing.  GI: See history of present illness. MS: Admits chronic back pain related to bulging disc.  Derm: Negative for rash or itching.  Endo: Negative for unusual weight change.  Heme: Negative for bruising or bleeding. Allergy: Negative for rash or hives.   Physical Exam: BP 111/65   Pulse 67   Temp 98.2 F (36.8 C) (Oral)   Ht 5\' 5"  (1.651 m)   Wt 189 lb 9.6 oz (86 kg)   BMI 31.55 kg/m  General:   Alert and oriented. Pleasant and cooperative. Well-nourished and well-developed.  Head:  Normocephalic and atraumatic. Eyes:  Without icterus, sclera clear and conjunctiva pink.  Ears:  Normal auditory acuity. Cardiovascular:  S1, S2 present without murmurs appreciated. Extremities without clubbing or edema. Respiratory:  Clear to auscultation bilaterally. No wheezes, rales, or rhonchi. No distress.  Gastrointestinal:  +BS, soft, and non-distended. Some RUQ abdominal pain to palpation especailly around one of the incisional scars. No HSM noted. No guarding or rebound. No masses appreciated.  Rectal:  Deferred  Musculoskalatal:  Symmetrical without gross deformities. Skin:  Three laproscopic abdominal incisional scars noted well healed. Neurologic:  Alert and oriented x4;  grossly  normal neurologically. Psych:  Alert and cooperative. Normal mood and affect. Heme/Lymph/Immune: No excessive bruising noted.    04/29/2016 10:05 AM   Disclaimer: This note was dictated with voice recognition software. Similar sounding words can inadvertently be transcribed and may not be corrected upon rev

## 2016-04-29 NOTE — Assessment & Plan Note (Signed)
Symptoms currently resolved. Continue to monitor. Return for follow-up in 3 months.

## 2016-04-29 NOTE — Patient Instructions (Addendum)
1. We will help schedule ultrasound of your abdomen. 2. Return for follow-up in 3 months. 3. Call us if any worsening or severe symptoms. 4. Below is information on a healthy diet to help prevent fatty liver. I also recommend exercise, at least 30 minutes a day 5 days a week. He may exercise less initially as tolerated. 5. Call if you have any questions.     Fatty Liver Introduction Fatty liver, also called hepatic steatosis or steatohepatitis, is a condition in which too much fat has built up in your liver cells. The liver removes harmful substances from your bloodstream. It produces fluids your body needs. It also helps your body use and store energy from the food you eat. In many cases, fatty liver does not cause symptoms or problems. It is often diagnosed when tests are being done for other reasons. However, over time, fatty liver can cause inflammation that may lead to more serious liver problems, such as scarring of the liver (cirrhosis). What are the causes? Causes of fatty liver may include:  Drinking too much alcohol.  Poor nutrition.  Obesity.  Cushing syndrome.  Diabetes.  Hyperlipidemia.  Pregnancy.  Certain drugs.  Poisons.  Some viral infections. What increases the risk? You may be more likely to develop fatty liver if you:  Abuse alcohol.  Are pregnant.  Are overweight.  Have diabetes.  Have hepatitis.  Have a high triglyceride level. What are the signs or symptoms? Fatty liver often does not cause any symptoms. In cases where symptoms develop, they can include:  Fatigue.  Weakness.  Weight loss.  Confusion.  Abdominal pain.  Yellowing of your skin and the white parts of your eyes (jaundice).  Nausea and vomiting. How is this diagnosed? Fatty liver may be diagnosed by:  Physical exam and medical history.  Blood tests.  Imaging tests, such as an ultrasound, CT scan, or MRI.  Liver biopsy. A small sample of liver tissue is removed  using a needle. The sample is then looked at under a microscope. How is this treated? Fatty liver is often caused by other health conditions. Treatment for fatty liver may involve medicines and lifestyle changes to manage conditions such as:  Alcoholism.  High cholesterol.  Diabetes.  Being overweight or obese. Follow these instructions at home:  Eat a healthy diet as directed by your health care provider.  Exercise regularly. This can help you lose weight and control your cholesterol and diabetes. Talk to your health care provider about an exercise plan and which activities are best for you.  Do not drink alcohol.  Take medicines only as directed by your health care provider. Contact a health care provider if: You have difficulty controlling your:  Blood sugar.  Cholesterol.  Alcohol consumption. Get help right away if:  You have abdominal pain.  You have jaundice.  You have nausea and vomiting. This information is not intended to replace advice given to you by your health care provider. Make sure you discuss any questions you have with your health care provider. Document Released: 04/12/2005 Document Revised: 08/03/2015 Document Reviewed: 07/07/2013  2017 Elsevier    Nonalcoholic Fatty Liver Disease Diet Introduction Nonalcoholic fatty liver disease is a condition that causes fat to accumulate in and around the liver. The disease makes it harder for the liver to work the way that it should. Following a healthy diet can help to keep nonalcoholic fatty liver disease under control. It can also help to prevent or improve conditions that are  associated with the disease, such as heart disease, diabetes, high blood pressure, and abnormal cholesterol levels. Along with regular exercise, this diet:  Promotes weight loss.  Helps to control blood sugar levels.  Helps to improve the way that the body uses insulin. What do I need to know about this diet?  Use the glycemic  index (GI) to plan your meals. The index tells you how quickly a food will raise your blood sugar. Choose low-GI foods. These foods take a longer time to raise blood sugar.  Keep track of how many calories you take in. Eating the right amount of calories will help you to achieve a healthy weight.  You may want to follow a Mediterranean diet. This diet includes a lot of vegetables, lean meats or fish, whole grains, fruits, and healthy oils and fats. What foods can I eat? Grains  Whole grains, such as whole-wheat or whole-grain breads, crackers, tortillas, cereals, and pasta. Stone-ground whole wheat. Pumpernickel bread. Unsweetened oatmeal. Bulgur. Barley. Quinoa. Brown or wild rice. Corn or whole-wheat flour tortillas. Vegetables  Lettuce. Spinach. Peas. Beets. Cauliflower. Cabbage. Broccoli. Carrots. Tomatoes. Squash. Eggplant. Herbs. Peppers. Onions. Cucumbers. Brussels sprouts. Yams and sweet potatoes. Beans. Lentils. Fruits  Bananas. Apples. Oranges. Grapes. Papaya. Mango. Pomegranate. Kiwi. Grapefruit. Cherries. Meats and Other Protein Sources  Seafood and shellfish. Lean meats. Poultry. Tofu. Dairy  Low-fat or fat-free dairy products, such as yogurt, cottage cheese, and cheese. Beverages  Water. Sugar-free drinks. Tea. Coffee. Low-fat or skim milk. Milk alternatives, such as soy or almond milk. Real fruit juice. Condiments  Mustard. Relish. Low-fat, low-sugar ketchup and barbecue sauce. Low-fat or fat-free mayonnaise. Sweets and Desserts  Sugar-free sweets. Fats and Oils  Avocado. Canola or olive oil. Nuts and nut butters. Seeds. The items listed above may not be a complete list of recommended foods or beverages. Contact your dietitian for more options.  What foods are not recommended? Palm oil and coconut oil. Processed foods. Fried foods. Sweetened drinks, such as sweet tea, milkshakes, snow cones, iced sweet drinks, and sodas. Alcohol. Sweets. Foods that contain a lot of salt or  sodium. The items listed above may not be a complete list of foods and beverages to avoid. Contact your dietitian for more information.  This information is not intended to replace advice given to you by your health care provider. Make sure you discuss any questions you have with your health care provider. Document Released: 07/12/2014 Document Revised: 08/03/2015 Document Reviewed: 03/22/2014  2017 Elsevier

## 2016-04-29 NOTE — Assessment & Plan Note (Signed)
Patient with persistent, although significantly improved, right upper quadrant pain. No more nausea and vomiting as per above. Abdominal imaging, specifically MRI/MRCP, demonstrated mild cholelithiasis, millimeter cyst in the medial segment of the hepatic dome, no hepatic steatosis, no suspicious or enhancing hepatic lesions. Previous right upper quadrant ultrasound demonstrated fatty infiltration of the liver. At this point I'll repeat right upper quadrant ultrasound to ensure no significant changes. Recommend diet next size for prevention/moderation of fatty liver. Return for follow-up in 3 months. Suspect slow to resolve surgical pain versus adhesions/scar tissue as the etiology behind her persistent but significantly improved abdominal pain. This is discussed with her.

## 2016-04-30 DIAGNOSIS — N611 Abscess of the breast and nipple: Secondary | ICD-10-CM | POA: Diagnosis not present

## 2016-05-01 ENCOUNTER — Ambulatory Visit (HOSPITAL_COMMUNITY)
Admission: RE | Admit: 2016-05-01 | Discharge: 2016-05-01 | Disposition: A | Discharge status: Home / Self Care | Payer: Federal, State, Local not specified - PPO | Source: Ambulatory Visit | Attending: Nurse Practitioner | Admitting: Nurse Practitioner

## 2016-05-01 ENCOUNTER — Telehealth: Payer: Self-pay

## 2016-05-01 ENCOUNTER — Other Ambulatory Visit: Payer: Self-pay

## 2016-05-01 DIAGNOSIS — R7989 Other specified abnormal findings of blood chemistry: Secondary | ICD-10-CM

## 2016-05-01 DIAGNOSIS — R1011 Right upper quadrant pain: Secondary | ICD-10-CM

## 2016-05-01 DIAGNOSIS — R112 Nausea with vomiting, unspecified: Secondary | ICD-10-CM | POA: Insufficient documentation

## 2016-05-01 DIAGNOSIS — R945 Abnormal results of liver function studies: Secondary | ICD-10-CM

## 2016-05-01 DIAGNOSIS — K7689 Other specified diseases of liver: Secondary | ICD-10-CM | POA: Diagnosis not present

## 2016-05-01 NOTE — Telephone Encounter (Signed)
Pt called office and said she had to take a day off from work unexpectedly and wants to do her Ultrasound abdomen today, she has not ate or drank anything. Called Central Scheduling and was advised that order needed to be put in as stat. Spoke with EG and he said it was ok to put order in for Ultrasound Abdomen Complete stat. Central Scheduling said [facility] and she will be a work in and may have to wait. Pt to remain NPO. Called pt back and told her.

## 2016-05-02 DIAGNOSIS — N611 Abscess of the breast and nipple: Secondary | ICD-10-CM | POA: Diagnosis not present

## 2016-05-02 DIAGNOSIS — Z6832 Body mass index (BMI) 32.0-32.9, adult: Secondary | ICD-10-CM | POA: Diagnosis not present

## 2016-05-03 DIAGNOSIS — Z888 Allergy status to other drugs, medicaments and biological substances status: Secondary | ICD-10-CM | POA: Diagnosis not present

## 2016-05-03 DIAGNOSIS — Z79899 Other long term (current) drug therapy: Secondary | ICD-10-CM | POA: Diagnosis not present

## 2016-05-03 DIAGNOSIS — Z9049 Acquired absence of other specified parts of digestive tract: Secondary | ICD-10-CM | POA: Diagnosis not present

## 2016-05-03 DIAGNOSIS — Z886 Allergy status to analgesic agent status: Secondary | ICD-10-CM | POA: Diagnosis not present

## 2016-05-03 DIAGNOSIS — J449 Chronic obstructive pulmonary disease, unspecified: Secondary | ICD-10-CM | POA: Diagnosis not present

## 2016-05-03 DIAGNOSIS — N611 Abscess of the breast and nipple: Secondary | ICD-10-CM | POA: Diagnosis not present

## 2016-05-04 DIAGNOSIS — Z48817 Encounter for surgical aftercare following surgery on the skin and subcutaneous tissue: Secondary | ICD-10-CM | POA: Diagnosis not present

## 2016-05-04 DIAGNOSIS — N611 Abscess of the breast and nipple: Secondary | ICD-10-CM | POA: Diagnosis not present

## 2016-05-05 DIAGNOSIS — Z48817 Encounter for surgical aftercare following surgery on the skin and subcutaneous tissue: Secondary | ICD-10-CM | POA: Diagnosis not present

## 2016-05-05 DIAGNOSIS — N611 Abscess of the breast and nipple: Secondary | ICD-10-CM | POA: Diagnosis not present

## 2016-05-06 DIAGNOSIS — N611 Abscess of the breast and nipple: Secondary | ICD-10-CM | POA: Diagnosis not present

## 2016-05-06 DIAGNOSIS — Z48817 Encounter for surgical aftercare following surgery on the skin and subcutaneous tissue: Secondary | ICD-10-CM | POA: Diagnosis not present

## 2016-05-08 ENCOUNTER — Telehealth: Payer: Self-pay | Admitting: Internal Medicine

## 2016-05-08 NOTE — Telephone Encounter (Signed)
Pt called for her ultrasound results from last week, I told her the nurse wasn't available and transferred call to VM

## 2016-05-08 NOTE — Telephone Encounter (Signed)
Routing to EG 

## 2016-05-13 ENCOUNTER — Telehealth: Payer: Self-pay

## 2016-05-13 DIAGNOSIS — R1084 Generalized abdominal pain: Secondary | ICD-10-CM

## 2016-05-13 NOTE — Telephone Encounter (Signed)
See result note.  

## 2016-05-13 NOTE — Telephone Encounter (Signed)
Doubtful "leftover" gallstones. They would have to be in her CBD I they were there and given normal CBD diameter and no elevation in LFTs this is very unlikely.   Lets check an abdominal XRay for significant stool burden/constipation. If OTC medications aren't working we can consider trial of Linzess (at her last visit she was having good, productive bowel movements without issues)  I put in an order for abd XRay, will call when we get the results.

## 2016-05-13 NOTE — Telephone Encounter (Signed)
I spoke with the pt about her U/S results. She said she is still having the abd pain and she was concerned about gall stones left over from her surgery. I went over her results with her. She said the pain was not as bad now as it was before but she was still having pain. No N/V.   Pt did say she was having some problems with constipation. She will take stool softeners and laxatives for several days and then she will have runny stool and then will go several days without a bm and then take stool softeners and laxatives and she keeps repeating this.  Today she had a bm without an stool softeners or laxatives.

## 2016-05-14 NOTE — Telephone Encounter (Signed)
Tried to call pt- NA- LMOM with recommendations.  

## 2016-05-15 NOTE — Telephone Encounter (Signed)
Pt is aware. She said she took an laxative yesterday and it worked good and she is ok right now. She is going to wait a day or two and if she starts having the same problems again she will go have the xray done.

## 2016-05-22 DIAGNOSIS — Z6832 Body mass index (BMI) 32.0-32.9, adult: Secondary | ICD-10-CM | POA: Diagnosis not present

## 2016-05-22 DIAGNOSIS — Z1329 Encounter for screening for other suspected endocrine disorder: Secondary | ICD-10-CM | POA: Diagnosis not present

## 2016-05-22 DIAGNOSIS — Z131 Encounter for screening for diabetes mellitus: Secondary | ICD-10-CM | POA: Diagnosis not present

## 2016-05-22 DIAGNOSIS — Z13220 Encounter for screening for lipoid disorders: Secondary | ICD-10-CM | POA: Diagnosis not present

## 2016-05-22 DIAGNOSIS — Z01419 Encounter for gynecological examination (general) (routine) without abnormal findings: Secondary | ICD-10-CM | POA: Diagnosis not present

## 2016-07-09 DIAGNOSIS — Z6837 Body mass index (BMI) 37.0-37.9, adult: Secondary | ICD-10-CM | POA: Diagnosis not present

## 2016-07-09 DIAGNOSIS — M65841 Other synovitis and tenosynovitis, right hand: Secondary | ICD-10-CM | POA: Diagnosis not present

## 2016-07-09 DIAGNOSIS — M65842 Other synovitis and tenosynovitis, left hand: Secondary | ICD-10-CM | POA: Diagnosis not present

## 2016-07-31 ENCOUNTER — Encounter: Payer: Self-pay | Admitting: Nurse Practitioner

## 2016-07-31 ENCOUNTER — Ambulatory Visit (INDEPENDENT_AMBULATORY_CARE_PROVIDER_SITE_OTHER): Payer: Federal, State, Local not specified - PPO | Admitting: Nurse Practitioner

## 2016-07-31 VITALS — BP 131/71 | HR 76 | Temp 97.8°F | Ht 64.0 in | Wt 195.2 lb

## 2016-07-31 DIAGNOSIS — R1011 Right upper quadrant pain: Secondary | ICD-10-CM

## 2016-07-31 DIAGNOSIS — R945 Abnormal results of liver function studies: Secondary | ICD-10-CM

## 2016-07-31 DIAGNOSIS — K219 Gastro-esophageal reflux disease without esophagitis: Secondary | ICD-10-CM

## 2016-07-31 DIAGNOSIS — R7989 Other specified abnormal findings of blood chemistry: Secondary | ICD-10-CM

## 2016-07-31 NOTE — Assessment & Plan Note (Addendum)
Continued abdominal pain which on phone note indicates improved with laxative and bowel movement but she denies this now. At this point I will have her complete the abdominal x-ray which she has not completed yet. I will check labs including CBC, CMP. Return for follow-up in 2 months. I will also have her start Linzess 72 g once a day to see if this helps with better control of constipation. Interestingly her pain is not elicited by right upper quadrant palpation in the abdomen or hooking under the rib cage. However, it does get triggered by pressing on the rib cage specifically. Possibly abdominal wall pain.  She states her last colonoscopy was a few years ago. The last colonoscopy that could be found in our system is 2009. This was essentially normal. I will have [facility] to ensure that there are no more recent colonoscopies and/or endoscopies. At this point given her chronic abdominal pain I feel that colonoscopy and endoscopy could be a last workup step to be completed before considering referral [facility] for chronic [facility] pain, possible abdominal wall pain.

## 2016-07-31 NOTE — Assessment & Plan Note (Signed)
History of abnormal LFTs which resolved on her last lab draw. I will recheck labs including CBC and CMP. I will also check lipase today. Return for follow-up in 2 months.

## 2016-07-31 NOTE — Assessment & Plan Note (Signed)
GERD symptoms generally well controlled on PPI. Recommend she continue PPI, return for follow-up as needed.

## 2016-07-31 NOTE — Patient Instructions (Signed)
1. Have your x-ray done when you can, within the next 1-2 days. 2. Have your labs drawn as soon as he can. 3. I am giving the samples of Linzess 72 g. Take 1 pill, once a day on an empty stomach. 4. We will give the samples to last 2 weeks. 5. Call us in 1-2 weeks and let us know if it is working any better than your over-the-counter laxative. 6. Return for follow-up in 2 months.

## 2016-07-31 NOTE — Progress Notes (Signed)
Referring Provider: Lovey Newcomer, PA Primary Care Physician:  Lovey Newcomer, PA Primary GI:  Dr. Jena Gauss  Chief Complaint  Patient presents with  . Nausea  . Abdominal Pain    ruq, has had gallbladder removed, still having problems    HPI:   Dana Clarke is a 56 y.o. female who presents For abdominal pain, nausea, vomiting. The patient was last seen in our office 04/29/2016 for right upper quadrant pain, nausea and vomiting. At that time it was noted she was seen in the emergency department for right upper quadrant pain and had recently had ultrasound imaging demonstrating cholelithiasis without cholecystitis, elevated LFTs but MRCP without common bile duct stones and felt the patient likely passed stone. She underwent laparoscopic cholecystectomy 01/29/2016 without complications. She was seen by primary care 2-1/2 months later with continued right upper quadrant pain after cholecystectomy and wanted further evaluation. Labs a noted normal LFTs, no leukocytosis or anemia, lipase normal. At her last office visit she noted continued but improved symptoms with right upper quadrant pain typically sharp, daily, but not as bad. No more nausea and vomiting. Bowel movement 1-2 times a day, occasionally constipated and takes stool softeners. Otherwise asymptomatic from a GI standpoint. Recommended abdominal ultrasound, call for worsening or severe symptoms, fatty liver healthy lifestyle, follow-up in 3 months.  Abdominal ultrasound completed 05/01/2016 found surgically absent gallbladder, normal common bile duct, left hepatic lobe cyst measuring 1.4 x 1.2 cm and noted 1.2 cm on previous MRI completed 2017, no hydronephrosis or renal calculi, no aortic aneurysm, diffuse increased echogenicity of the liver suspicious for fatty infiltration.  Telephone call from the patient in March 2018 noted significant constipation requiring laxative to have a bowel movement and continued abdominal pain,  concerned about retained stone status post surgical removal of her gallbladder. She called back to say she took a laxative, had a good bowel movement and is doing much better.  Patient is a poor historian. Today she states her abdominal pain is still hurting. Initially denies her pain ever got better. I read her the phone note at which point she said her pain did improve that day but came back; she never got the followup XRay. Pain is RUQ, worse at night when laying down, constant. Intermittently right side. States she was put on steroids for hand pain at which point her right side "blowed up; I'm not sure if it's liver, pancreas, or intestines." Occasional heartburn symptoms. Pain described as a dull ache "like something is crawling up inside." Denies hematochezia, melena. Takes a stool softener, bowel movents are variable in frequency. After asking several times about acute changes in bowel consistency (loose versus hard) she discussed the color, the smell, then number but never answers the question. Denies chest pain, dyspnea, dizziness, lightheadedness, syncope, near syncope. Denies any other upper or lower GI symptoms.  She states she's had 2-3 colonoscopies at [facility], the last about 3-5 years ago. The last one available in Epic is 2009 but she thinks she's had one since then.  Past Medical History:  Diagnosis Date  . Abdominal pain    chronic right sided abd pain, gallbladder w/u negative in 2009 (u/s and HIDA), repeat u/s negative in 2014, 03/2015  . Bell's palsy   . Diverticula of colon   . GERD (gastroesophageal reflux disease)   . Rectocele   . Seasonal allergies   . Stress     Past Surgical History:  Procedure Laterality Date  . CHOLECYSTECTOMY  N/A 01/29/2016   Procedure: LAPAROSCOPIC CHOLECYSTECTOMY;  Surgeon: Ancil LinseyJason Evan Davis, MD;  Location: [facility];  Service: General;  Laterality: N/A;  . COLONOSCOPY  01/2008   [id number]:friable anal canal, pancolonic diverticula, normal terminal  ileium  . ESOPHAGOGASTRODUODENOSCOPY  01/11/2008    UJW:[id number]:Normal esophagus, small hiatal hernia, tiny antral erosions.  Otherwise unremarkable gastric mucosa, patent pylorus, normal D1 and D2.   Marland Kitchen. SIGMOIDOSCOPY  02/12/96   interenal hemorrhoids  . TONSILLECTOMY      Current Outpatient Prescriptions  Medication Sig Dispense Refill  . acetaminophen (TYLENOL) 500 MG tablet Take 1,000 mg by mouth every 6 (six) hours as needed for moderate pain.    . cetirizine (ZYRTEC) 10 MG tablet Take 10 mg by mouth daily.    Marland Kitchen. dextromethorphan-guaiFENesin (MUCINEX DM) 30-600 MG 12hr tablet Take 1 tablet by mouth 2 (two) times daily.    Marland Kitchen. gabapentin (NEURONTIN) 100 MG capsule Take 100 mg by mouth daily.    Marland Kitchen. loratadine (CLARITIN) 10 MG tablet Take 10 mg by mouth daily.    . Naproxen Sod-Diphenhydramine (ALEVE PM) 220-25 MG TABS Take 1 tablet by mouth daily as needed (sleep).    . zolpidem (AMBIEN) 10 MG tablet Take 2.5 mg by mouth at bedtime as needed for sleep.    . celecoxib (CELEBREX) 200 MG capsule Take 200 mg by mouth 2 (two) times daily.    . Probiotic Product (PROBIOTIC DAILY PO) Take by mouth daily.     No current facility-administered medications for this visit.     Allergies as of 07/31/2016 - Review Complete 07/31/2016  Allergen Reaction Noted  . Codeine Itching 08/20/2011  . Dilaudid [hydromorphone hcl] Other (See Comments) 01/26/2016    Family History  Problem Relation Age of Onset  . COPD Father        deceased age 56  . Heart disease Mother        deceased age 4670s  . Diabetes Mother   . Cirrhosis Brother        etoh  . Bell's palsy Brother   . Stroke Brother   . Breast cancer Maternal Aunt        2 maternal great aunt.  . Breast cancer Paternal Aunt   . Colon cancer Neg Hx     Social History   Social History  . Marital status: Divorced    Spouse name: N/A  . Number of children: 3  . Years of education: N/A   Occupational History  .  [employer]   Social History  Main Topics  . Smoking status: Never Smoker  . Smokeless tobacco: Never Used  . Alcohol use No  . Drug use: No  . Sexual activity: Not Asked   Other Topics Concern  . None   Social History Narrative  . None    Review of Systems: General: Negative for anorexia, weight loss, fever, chills, fatigue, weakness. ENT: Negative for hoarseness, difficulty swallowing. CV: Negative for chest pain, angina, palpitations, peripheral edema.  Respiratory: Negative for dyspnea at rest, cough, sputum, wheezing.  GI: See history of present illness. Endo: Negative for unusual weight change.  Heme: Negative for bruising or bleeding. Allergy: Negative for rash or hives.   Physical Exam: BP 131/71   Pulse 76   Temp 97.8 F (36.6 C) (Oral)   Ht 5\' 4"  (1.626 m)   Wt 195 lb 3.2 oz (88.5 kg)   BMI 33.51 kg/m  General:   Obese female. Alert and oriented. Pleasant and cooperative.  Well-nourished and well-developed.  Head:  Normocephalic and atraumatic. Eyes:  Without icterus, sclera clear and conjunctiva pink.  Ears:  Normal auditory acuity. Cardiovascular:  S1, S2 present without murmurs appreciated. Extremities without clubbing or edema. Respiratory:  Clear to auscultation bilaterally. No wheezes, rales, or rhonchi. No distress.  Gastrointestinal:  +BS, soft, non-tender and non-distended. No HSM noted. No masses appreciated.  Rectal:  Deferred  Musculoskalatal:  Symmetrical without gross deformities. Neurologic:  Alert and oriented x4;  grossly normal neurologically. Psych:  Alert and cooperative. Normal mood and affect. Heme/Lymph/Immune: No excessive bruising noted.    07/31/2016 5:01 PM   Disclaimer: This note was dictated with voice recognition software. Similar sounding words can inadvertently be transcribed and may not be corrected upon review.

## 2016-08-01 ENCOUNTER — Encounter: Payer: Self-pay | Admitting: Internal Medicine

## 2016-08-01 NOTE — Progress Notes (Signed)
cc'ed to pcp °

## 2016-08-21 DIAGNOSIS — M65842 Other synovitis and tenosynovitis, left hand: Secondary | ICD-10-CM | POA: Diagnosis not present

## 2016-08-21 DIAGNOSIS — M65841 Other synovitis and tenosynovitis, right hand: Secondary | ICD-10-CM | POA: Diagnosis not present

## 2016-09-13 DIAGNOSIS — R062 Wheezing: Secondary | ICD-10-CM | POA: Diagnosis not present

## 2016-09-13 DIAGNOSIS — Z6838 Body mass index (BMI) 38.0-38.9, adult: Secondary | ICD-10-CM | POA: Diagnosis not present

## 2016-09-13 DIAGNOSIS — J019 Acute sinusitis, unspecified: Secondary | ICD-10-CM | POA: Diagnosis not present

## 2016-09-16 DIAGNOSIS — Z6838 Body mass index (BMI) 38.0-38.9, adult: Secondary | ICD-10-CM | POA: Diagnosis not present

## 2016-09-16 DIAGNOSIS — R062 Wheezing: Secondary | ICD-10-CM | POA: Diagnosis not present

## 2016-09-16 DIAGNOSIS — J019 Acute sinusitis, unspecified: Secondary | ICD-10-CM | POA: Diagnosis not present

## 2016-09-24 ENCOUNTER — Ambulatory Visit: Payer: Federal, State, Local not specified - PPO | Admitting: Internal Medicine

## 2017-01-04 DIAGNOSIS — Z23 Encounter for immunization: Secondary | ICD-10-CM | POA: Diagnosis not present

## 2017-01-04 DIAGNOSIS — Z6838 Body mass index (BMI) 38.0-38.9, adult: Secondary | ICD-10-CM | POA: Diagnosis not present

## 2017-01-04 DIAGNOSIS — M25512 Pain in left shoulder: Secondary | ICD-10-CM | POA: Diagnosis not present

## 2017-01-04 DIAGNOSIS — M25511 Pain in right shoulder: Secondary | ICD-10-CM | POA: Diagnosis not present

## 2017-04-22 IMAGING — US US ABDOMEN COMPLETE
1 series · 13 of 25 positions shown · non-contrast
Comparison: 01/24/2016, abdominal MRI 01/25/2016

CLINICAL DATA: Chronic right upper quadrant pain, increased LFTs

EXAM:
ABDOMEN ULTRASOUND COMPLETE

[Series 1: us abdomen complete · 0.22mm/px · 13 of 91 slices shown]
[im 1/91]
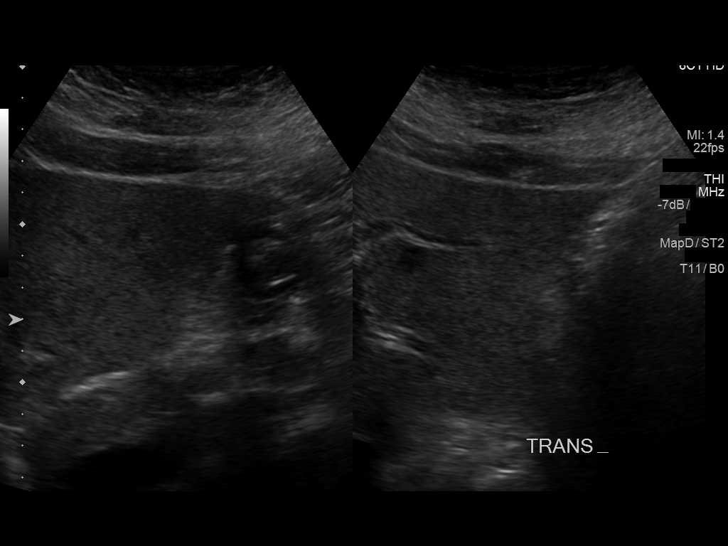
[im 8/91]
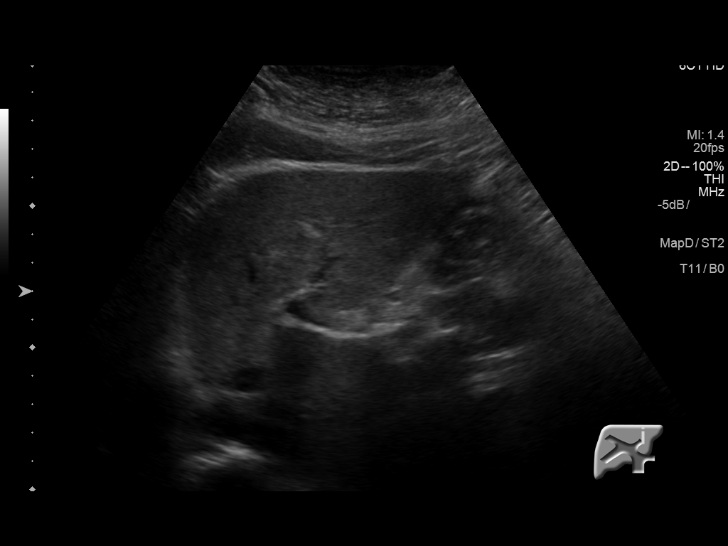
[im 16/91]
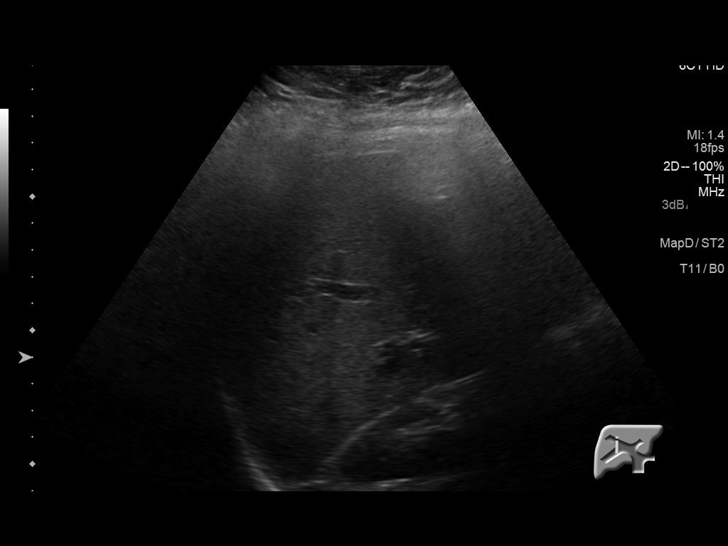
[im 23/91]
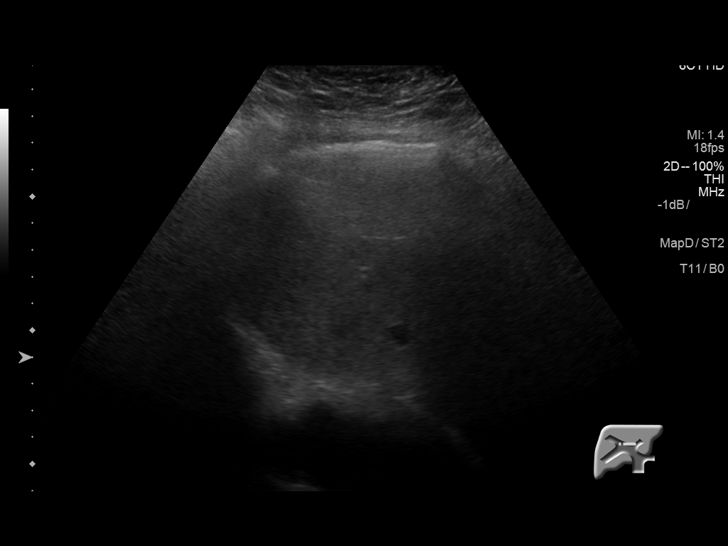
[im 31/91]
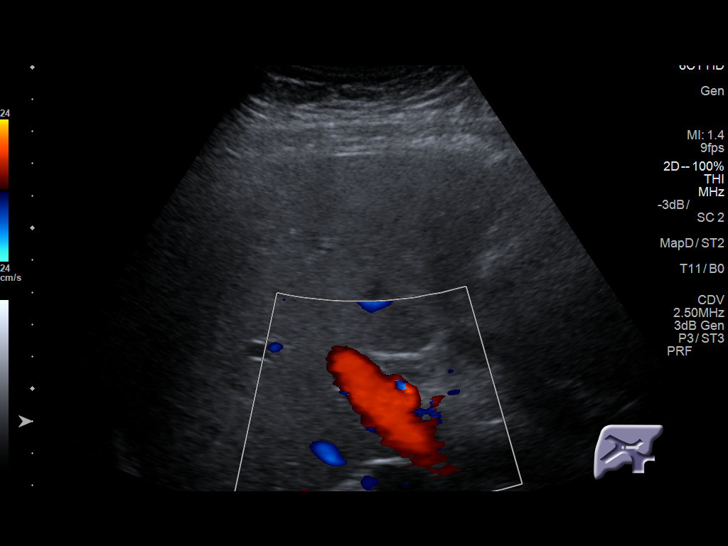
[im 38/91]
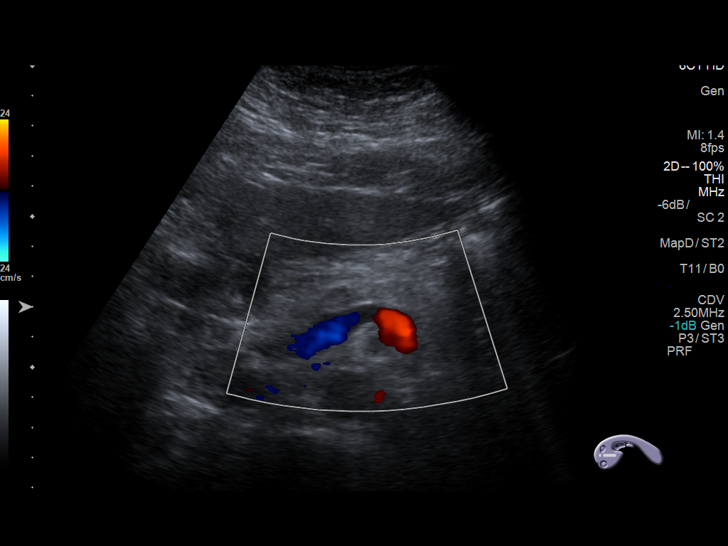
[im 46/91]
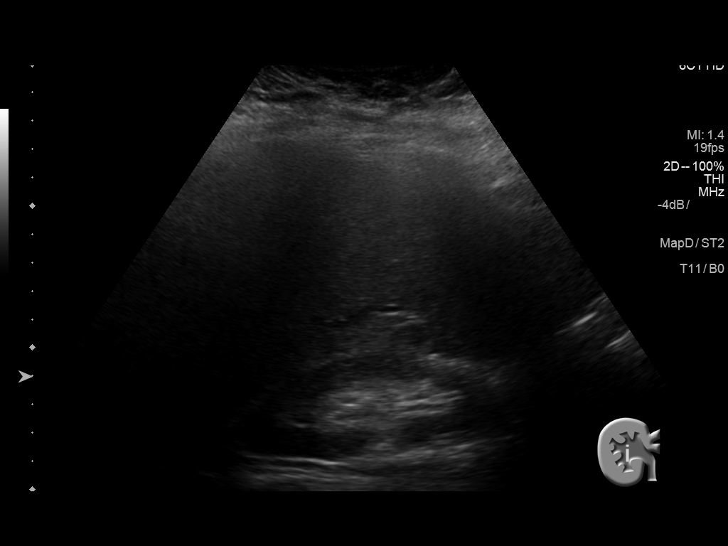
[im 53/91]
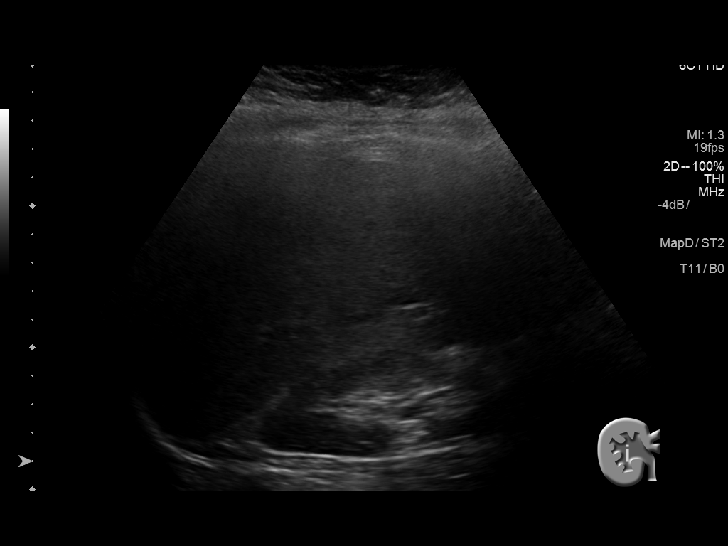
[im 61/91]
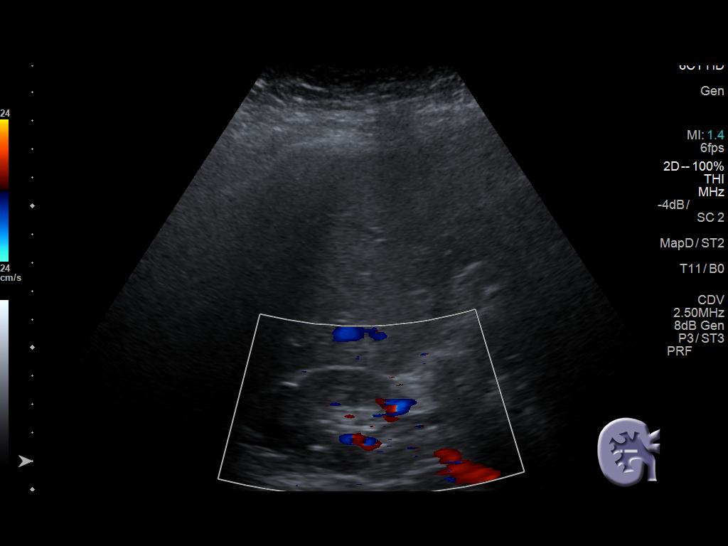
[im 68/91]
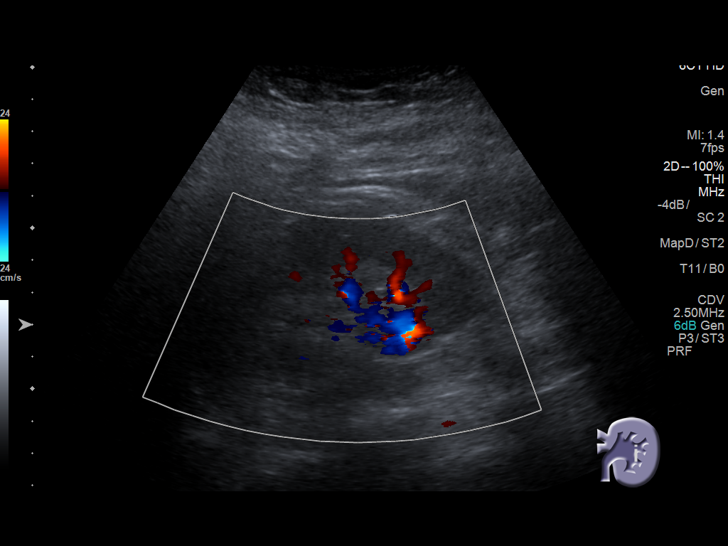
[im 76/91]
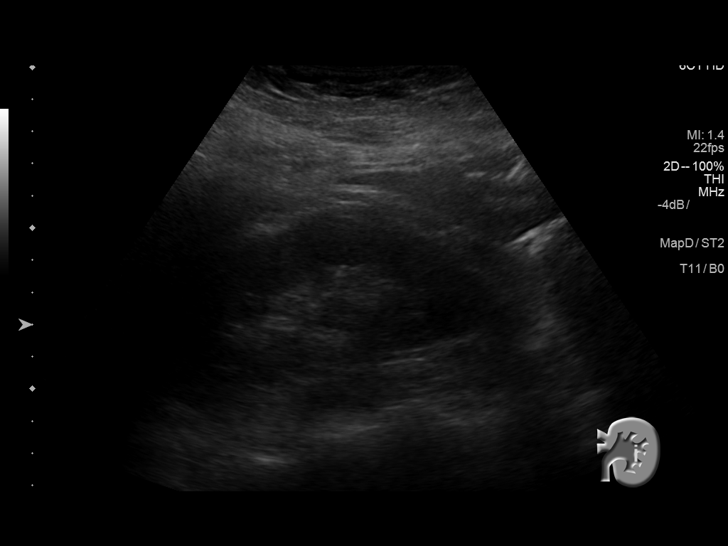
[im 83/91]
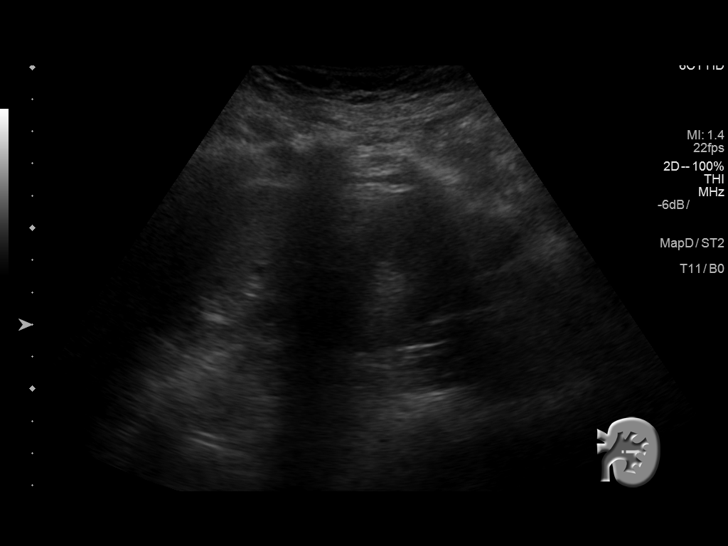
[im 91/91]
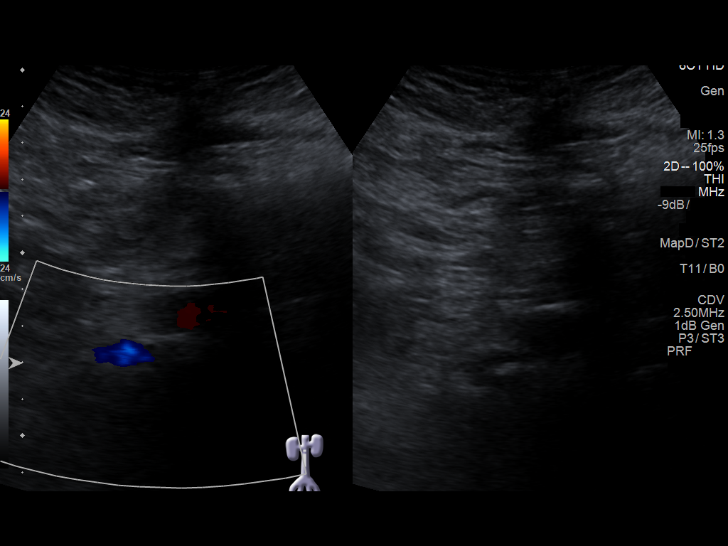

[13 of 25 positions shown; findings below may reference images not displayed]

FINDINGS: Gallbladder: Surgically absent

Common bile duct: Diameter: 7 mm in diameter within normal limits

Liver: There is diffuse increased echogenicity of the liver
suspicious for fatty infiltration. Again noted cyst in left hepatic
hepatic lobe measures 1.4 x 1.2 cm. On the prior MRI this measures
1.2 cm.

IVC: No abnormality visualized.

Pancreas: Visualized portion unremarkable.

Spleen: Size and appearance within normal limits. Measures 9.7 cm in
length.

Right Kidney: Length: 10.2 cm. Echogenicity within normal limits. No
mass or hydronephrosis visualized.

Left Kidney: Length: 10.4 cm. Echogenicity within normal limits. No
mass or hydronephrosis visualized.

Abdominal aorta: No aneurysm visualized. Measures up to 2.6 cm in
diameter

Other findings: None.
IMPRESSION: 1. Surgically absent gallbladder.  Normal CBD.
2. Again noted cyst in left hepatic lobe measures 1.4 x 1.2 cm on
the prior MRI measures 1.2 cm.
3. No hydronephrosis or renal calculi.
4. No aortic aneurysm.
5. There is diffuse increased echogenicity of the liver suspicious
for fatty infiltration.

## 2017-07-10 DIAGNOSIS — G4700 Insomnia, unspecified: Secondary | ICD-10-CM | POA: Diagnosis not present

## 2017-07-10 DIAGNOSIS — J0101 Acute recurrent maxillary sinusitis: Secondary | ICD-10-CM | POA: Diagnosis not present

## 2017-07-10 DIAGNOSIS — Z6835 Body mass index (BMI) 35.0-35.9, adult: Secondary | ICD-10-CM | POA: Diagnosis not present

## 2017-07-10 DIAGNOSIS — J029 Acute pharyngitis, unspecified: Secondary | ICD-10-CM | POA: Diagnosis not present

## 2017-09-21 IMAGING — MR MR ABDOMEN WO/W CM MRCP
17 of 21 series · 36 of 48 positions shown · IV contrast (multihance)
Comparison: Abdominal ultrasound dated 01/24/2016

CLINICAL DATA: Right upper quadrant pain x2 weeks, abnormal LFTs

EXAM:
MRI ABDOMEN WITHOUT AND WITH CONTRAST (INCLUDING MRCP)
TECHNIQUE: Multiplanar multisequence MR imaging of the abdomen was performed
both before and after the administration of intravenous contrast.
Heavily T2-weighted images of the biliary and pancreatic ducts were
obtained, and three-dimensional MRCP images were rendered by post
processing.
CONTRAST:  15mL MULTIHANCE GADOBENATE DIMEGLUMINE 529 MG/ML IV SOLN

[Series 3: T2 · coronal · 5.0mm · 1.25mm/px · 1 of 36 slices shown (1 of 2)]
[im 1/36]
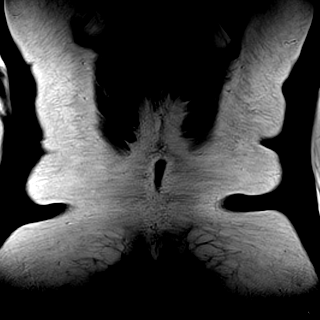

[Series 4: t2fs axial blade · axial · 4.0mm · 1.01mm/px · 1 of 48 slices shown]
[im 1/48]
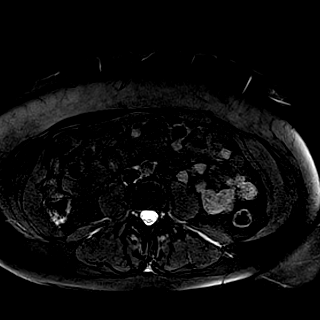

[Series 5: MRCP · coronal · 4.0mm · 0.78mm/px · 1 of 15 slices shown]
[im 1/15]
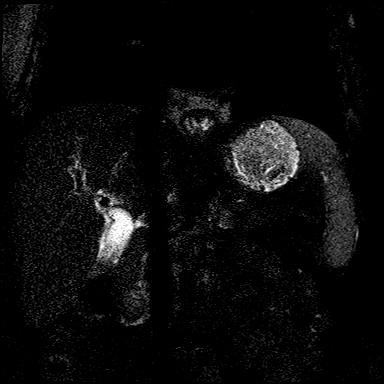

[Series 10: DWI · axial · 5.0mm · 0.88mm/px · z∈[-77,+157]mm · 2 of 80 slices shown (1 of 2)]
[im 1/80]
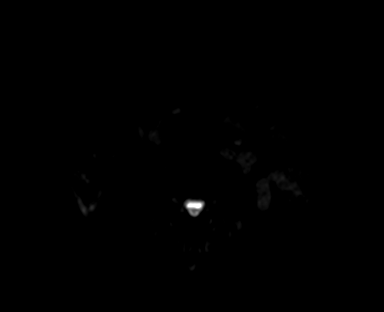
[im 80/80]
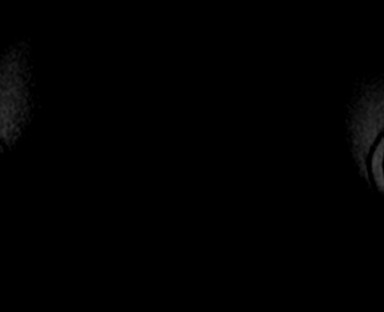

[Series 11: DWI · axial · 5.0mm · 0.88mm/px · 1 of 40 slices shown (2 of 2)]
[im 1/40]
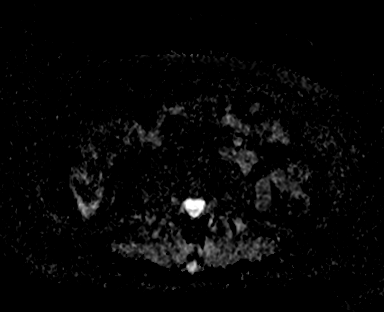

[Series 12: bSSFP · axial · 4.0mm · 1.21mm/px · z∈[-112,+164]mm · 2 of 70 slices shown]
[im 1/70]
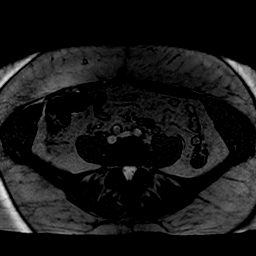
[im 70/70]
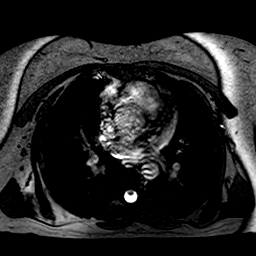

[Series 13: T1 · axial · 4.0mm · 0.51mm/px · z∈[-99,+153]mm · 2 of 64 slices shown (1 of 3)]
[im 1/64]
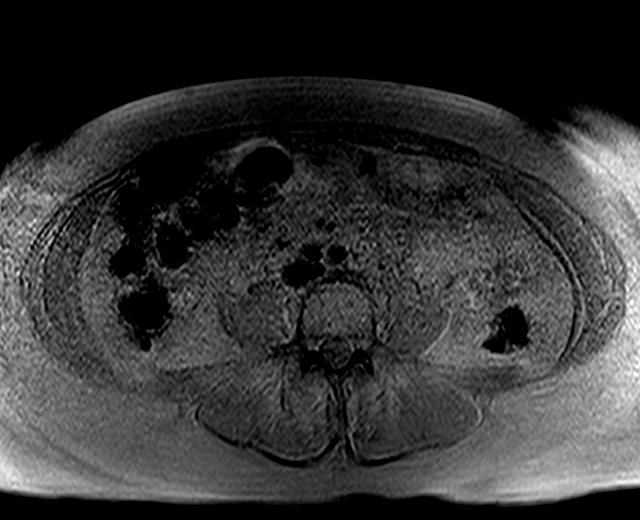
[im 64/64]
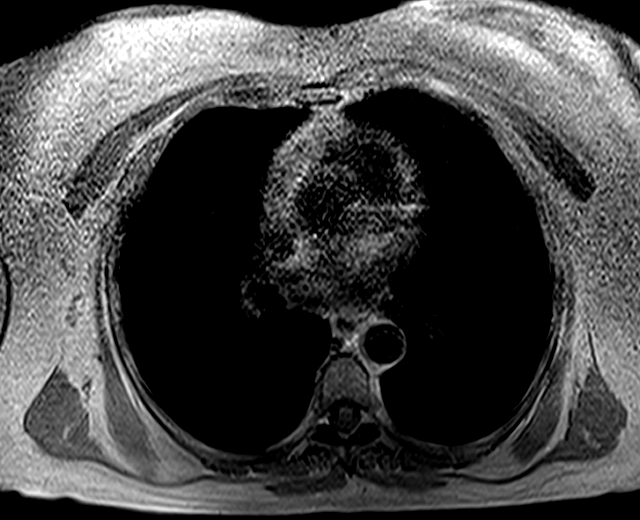

[Series 14: T1 · axial · 4.0mm · 0.51mm/px · z∈[-99,+153]mm · 2 of 64 slices shown (2 of 3)]
[im 1/64]
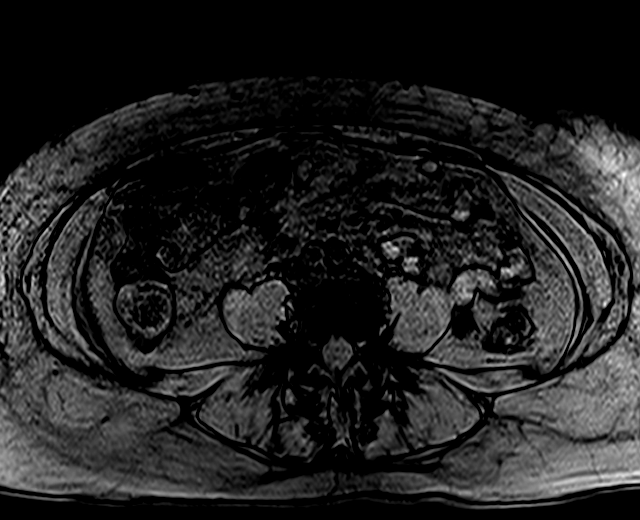
[im 64/64]
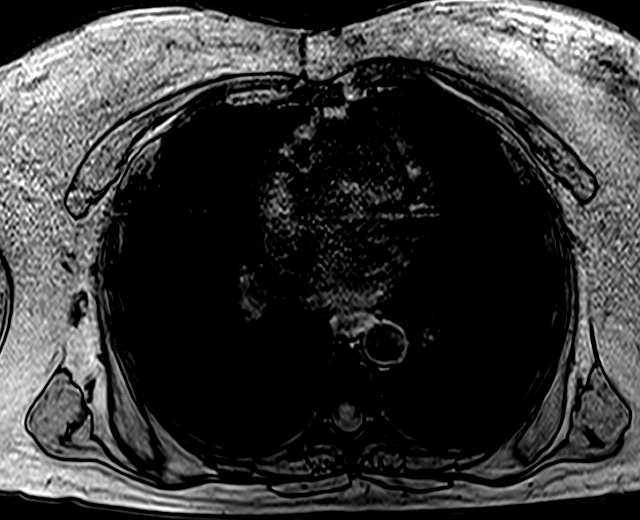

[Series 16: T1 · axial · 4.0mm · 0.51mm/px · z∈[-99,+153]mm · 2 of 64 slices shown (3 of 3)]
[im 1/64]
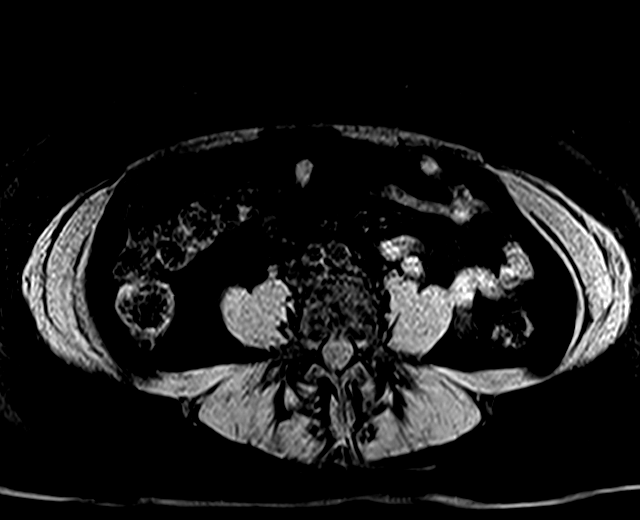
[im 64/64]
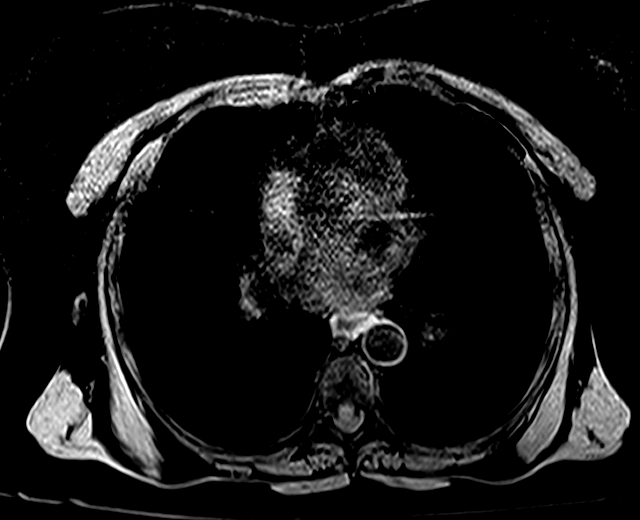

[Series 17: pre test · axial · non-contrast · 3.5mm · 1.19mm/px · z∈[-91,+158]mm · 3 of 72 slices shown]
[im 1/72]
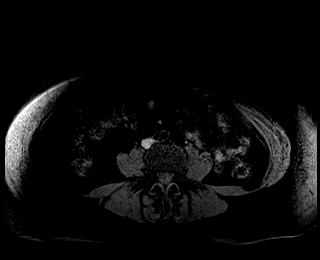
[im 36/72]
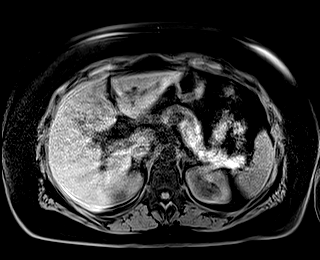
[im 72/72]
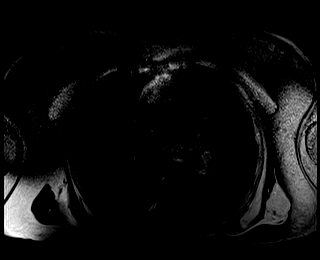

[Series 26: T2 · axial · 5.0mm · 1.25mm/px · z∈[-83,+151]mm · 2 of 40 slices shown (2 of 2)]
[im 1/40]
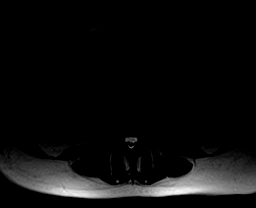
[im 40/40]
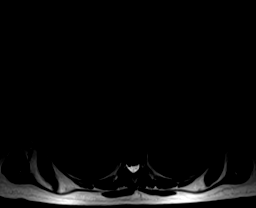

[Series 27: t1fs coronal post · coronal · 2.5mm · 1.18mm/px · 4 of 96 slices shown]
[im 1/96]
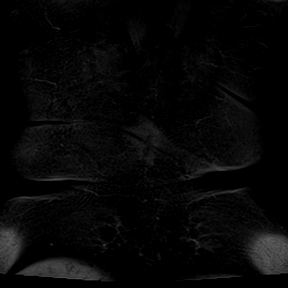
[im 32/96]
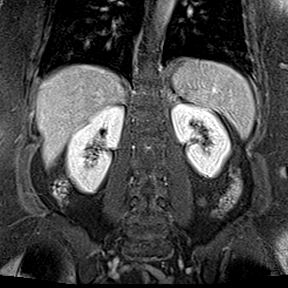
[im 64/96]
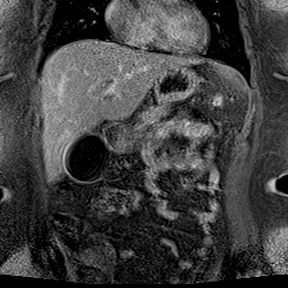
[im 96/96]
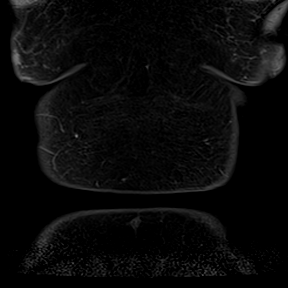

[Series 28: 5 min delay · axial · delayed · 3.5mm · 1.19mm/px · z∈[-91,+158]mm · 3 of 72 slices shown]
[im 1/72]
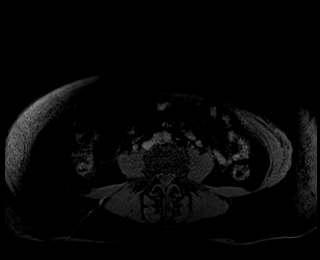
[im 36/72]
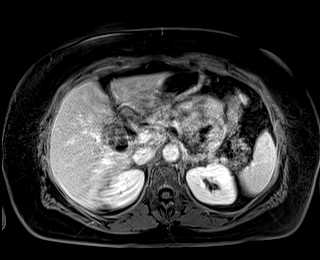
[im 72/72]
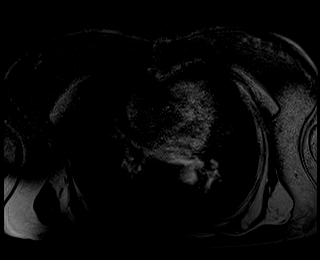

[Series 29: 5 min delay_sub · axial · 3.5mm · 1.19mm/px · z∈[-91,+158]mm · 3 of 72 slices shown]
[im 1/72]
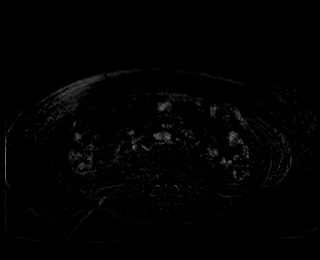
[im 36/72]
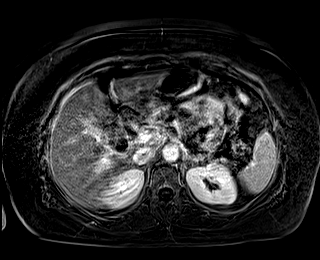
[im 72/72]
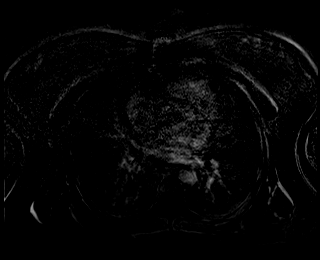

[Series 5002: (id) · axial · 3.5mm · 1.19mm/px · z∈[-91,+158]mm · 3 of 72 slices shown (1 of 3)]
[im 1/72]
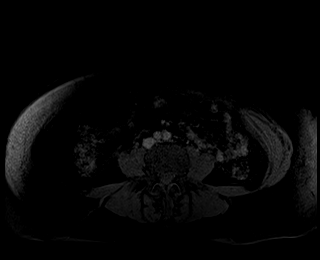
[im 36/72]
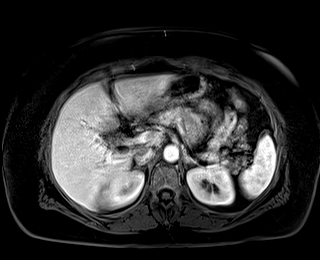
[im 72/72]
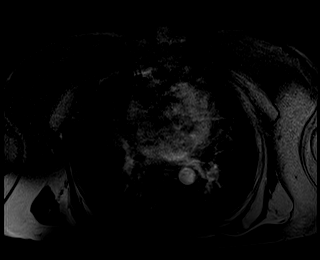

[Series 5003: (id) · axial · 3.5mm · 1.19mm/px · z∈[-91,+158]mm · 3 of 72 slices shown (2 of 3)]
[im 1/72]
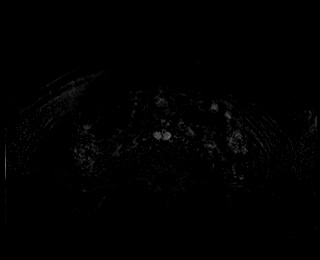
[im 36/72]
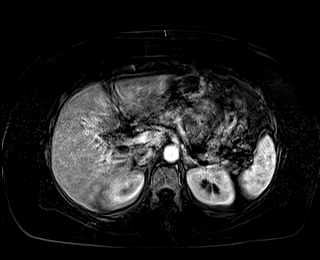
[im 72/72]
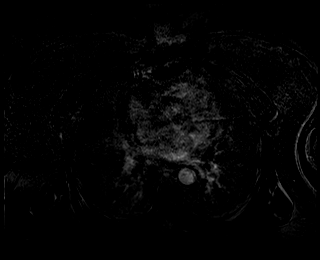

[Series 5004: (id) · axial · 3.5mm · 1.19mm/px · 1 of 72 slices shown (3 of 3)]
[im 1/72]
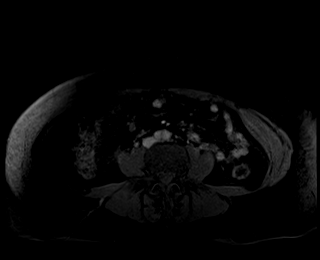

[36 of 48 positions shown; findings below may reference images not displayed]

FINDINGS: Severely motion degraded images.

Lower chest: Lung bases are essentially clear.

Hepatobiliary: 12 mm cyst in the medial segment left hepatic dome
(series 4/ image 11). No hepatic steatosis. No suspicious/enhancing
hepatic lesions.

Suspected tiny layering gallstones (series 4/ image 35). No definite
gallbladder wall thickening. Trace pericholecystic fluid.

Mild periportal edema. No intrahepatic ductal dilatation. Mildly
prominent common duct, measuring 7 mm, within the upper limits of
normal. No choledocholithiasis is seen.

Pancreas:  Within normal limits.

Spleen:  Within normal limits.

Adrenals/Urinary Tract:  Adrenal glands are within normal limits.

Small bilateral renal cysts, measuring up to 1.6 cm in the posterior
right upper kidney (series 4/image 28).

No hydronephrosis.

Stomach/Bowel: Stomach is within normal limits.

Visualized bowel is unremarkable.

Vascular/Lymphatic:  No evidence of abdominal aortic aneurysm.

No suspicious abdominal lymphadenopathy.

Other:  No abdominal ascites.

Musculoskeletal: No focal osseous lesions.
IMPRESSION: Motion degraded images, limiting evaluation.

Mild cholelithiasis. Pericholecystic fluid/inflammatory changes,
progressed from recent ultrasound, but without gallbladder wall
thickening.

Mild periportal edema. Mildly prominent common duct, measuring 7 mm.
No choledocholithiasis is seen.

Overall appearance favors a right upper quadrant inflammatory
process such as hepatitis or early acute cholecystitis, recent
passage of a common duct stone, or a nonvisualized distal CBD stone
or stricture.

If there is clinical concern for acute cholecystitis, consider
hepatobiliary nuclear medicine scan. Otherwise, consider ERCP to
assess for nonvisualized common duct stone/stricture.

## 2017-09-25 IMAGING — DX DG CHEST 2V
2 series · 2 of 2 positions shown · non-contrast
Comparison: Chest x-ray of August 30, 2015

CLINICAL DATA: Preoperative examination prior to cholecystectomy
today. Nonsmoker

EXAM:
CHEST  2 VIEW

[chest pa]
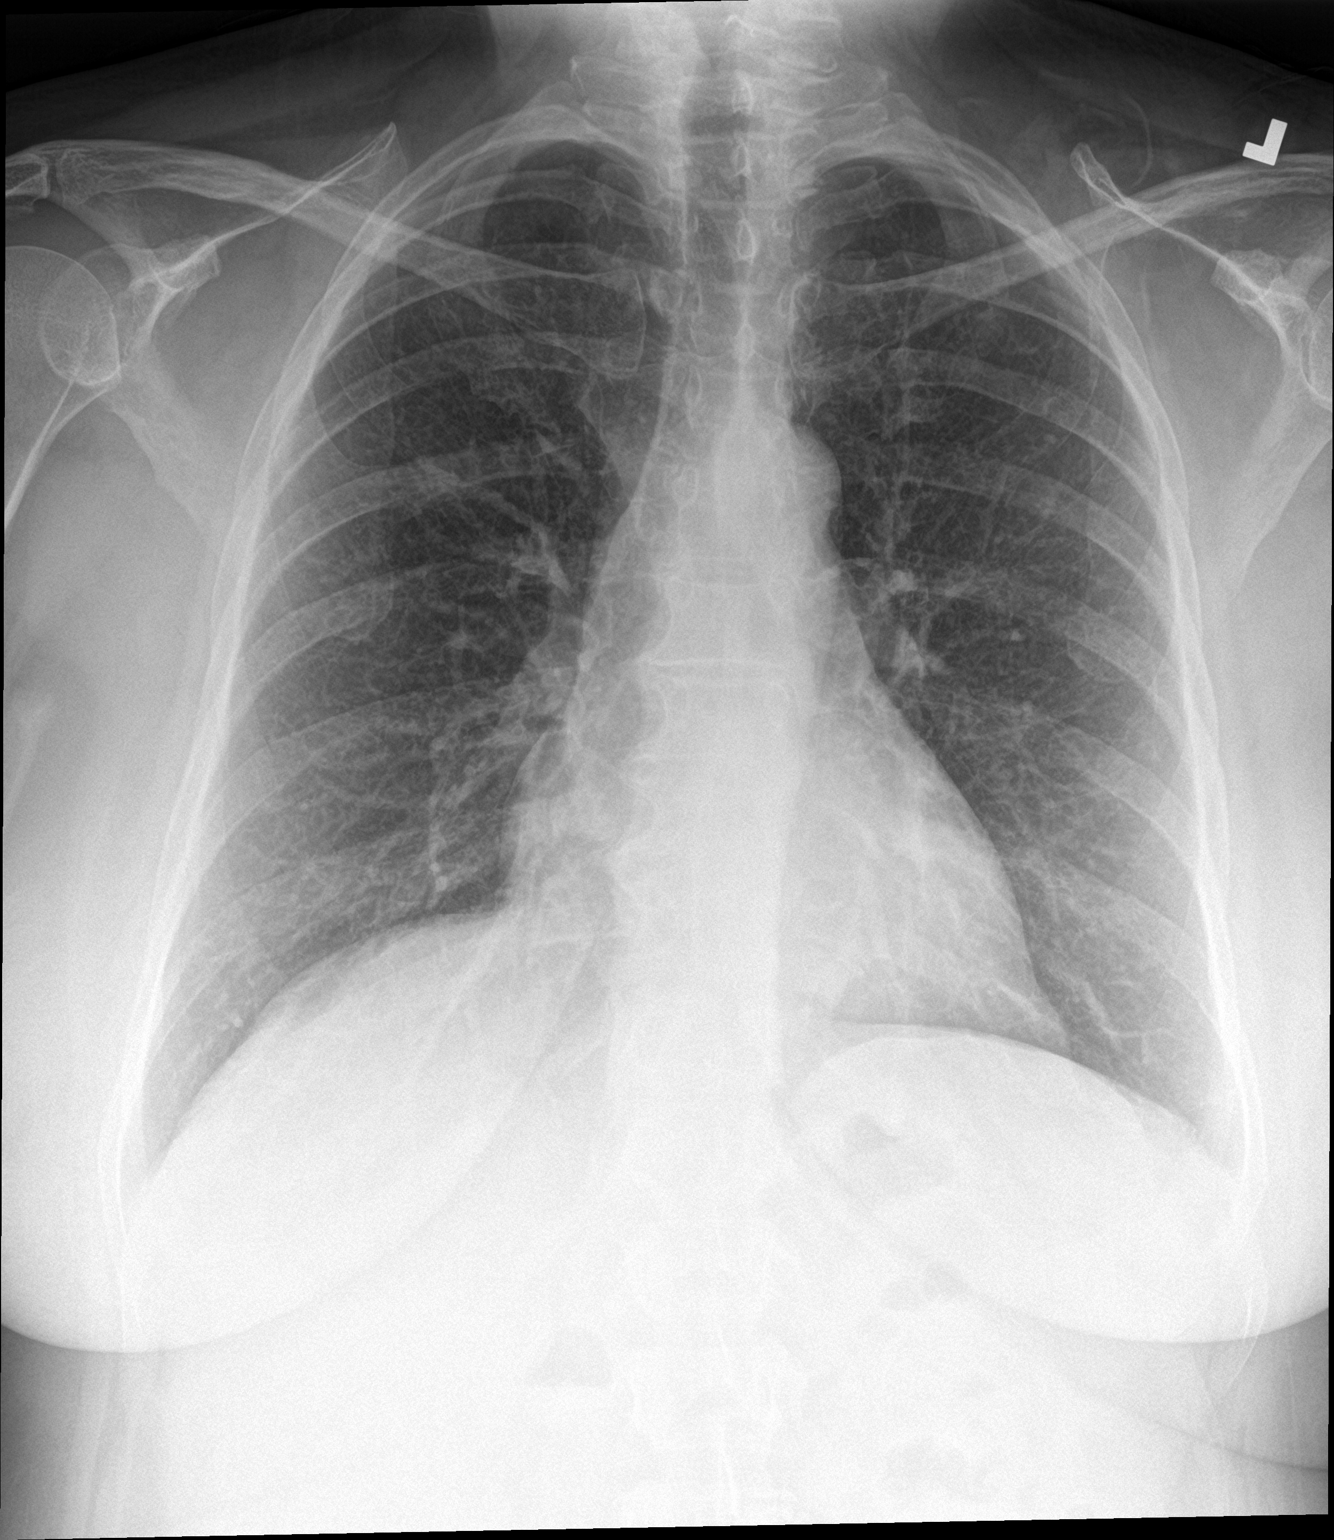

[chest lat]
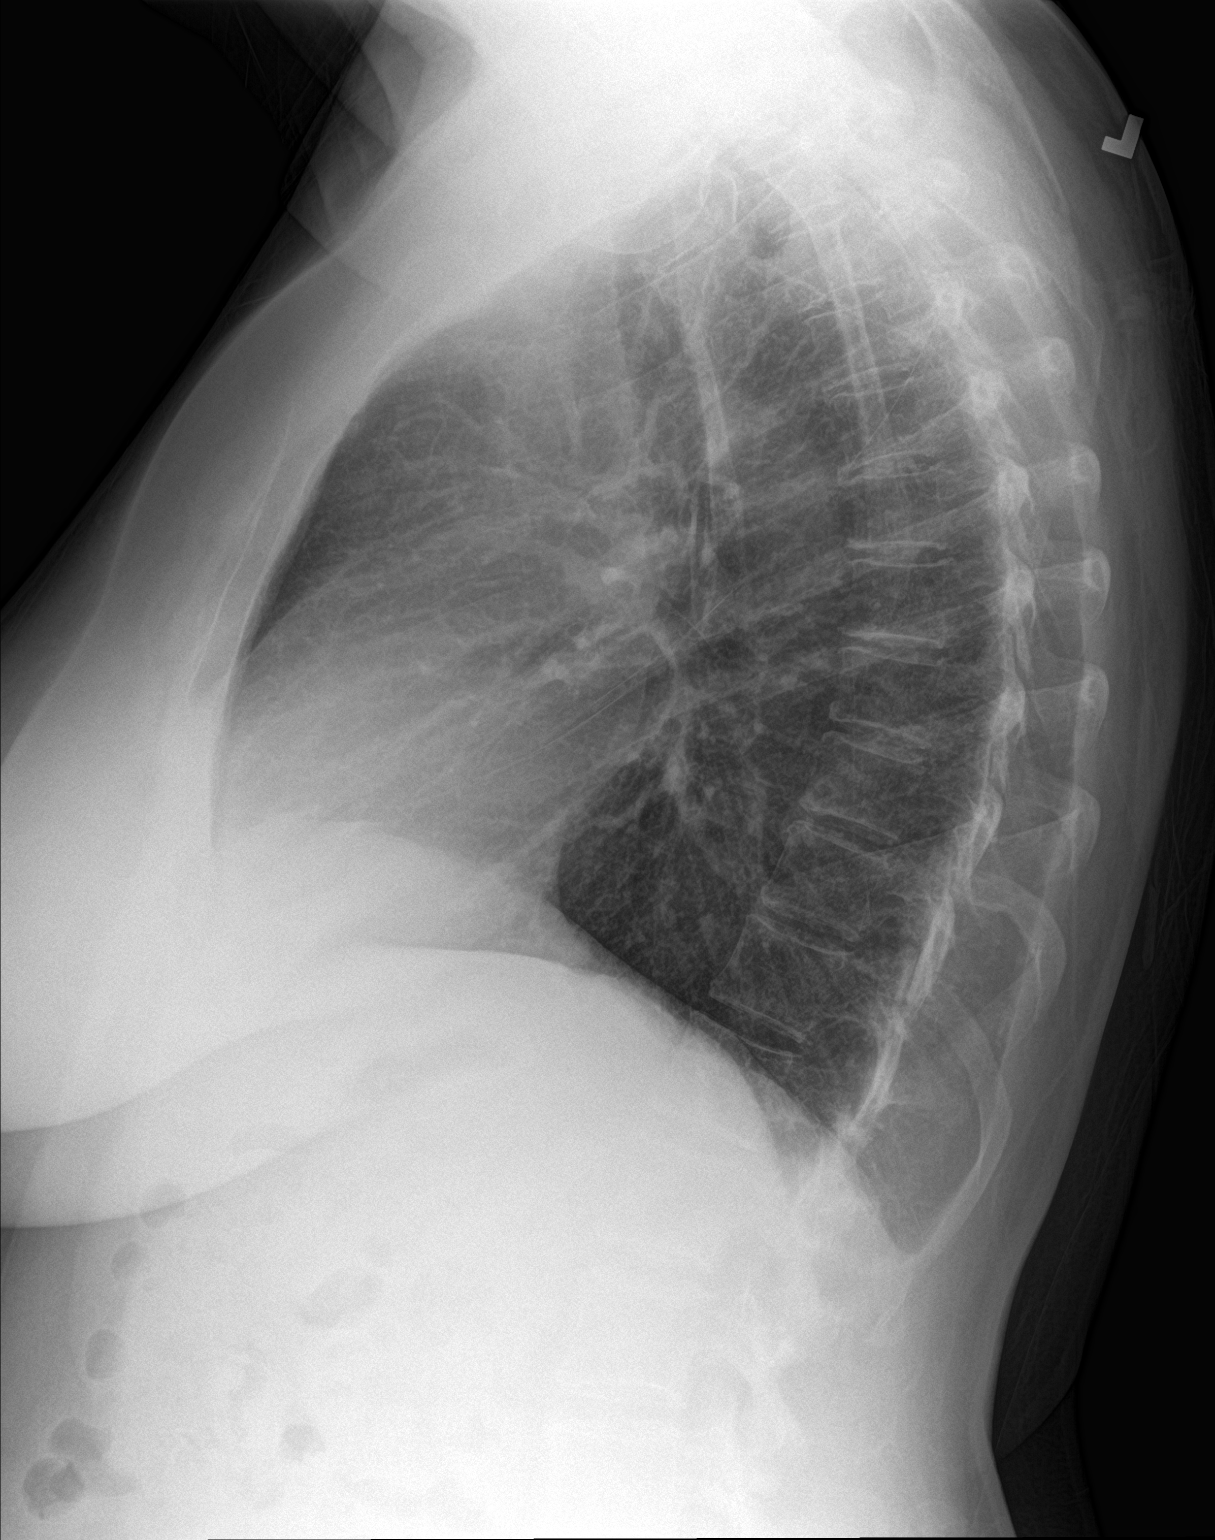

[2 of 2 positions shown; findings below may reference images not displayed]

FINDINGS: The lungs are well-expanded. There is no focal infiltrate. There is
no pleural effusion. The heart and pulmonary vascularity are normal.
The mediastinum is normal in width. The trachea is midline. There is
calcification in the wall of the aortic arch. The bony thorax is
unremarkable.
IMPRESSION: There is no active cardiopulmonary disease.

Thoracic aortic atherosclerosis.

## 2017-10-01 DIAGNOSIS — Z6835 Body mass index (BMI) 35.0-35.9, adult: Secondary | ICD-10-CM | POA: Diagnosis not present

## 2017-10-01 DIAGNOSIS — M79674 Pain in right toe(s): Secondary | ICD-10-CM | POA: Diagnosis not present

## 2017-10-01 DIAGNOSIS — M79671 Pain in right foot: Secondary | ICD-10-CM | POA: Diagnosis not present

## 2017-10-01 DIAGNOSIS — M545 Low back pain: Secondary | ICD-10-CM | POA: Diagnosis not present

## 2017-12-23 DIAGNOSIS — M7741 Metatarsalgia, right foot: Secondary | ICD-10-CM | POA: Diagnosis not present

## 2017-12-23 DIAGNOSIS — Z6837 Body mass index (BMI) 37.0-37.9, adult: Secondary | ICD-10-CM | POA: Diagnosis not present

## 2018-01-12 ENCOUNTER — Ambulatory Visit (INDEPENDENT_AMBULATORY_CARE_PROVIDER_SITE_OTHER): Payer: Federal, State, Local not specified - PPO | Admitting: Physician Assistant

## 2018-01-12 ENCOUNTER — Ambulatory Visit (INDEPENDENT_AMBULATORY_CARE_PROVIDER_SITE_OTHER): Payer: Self-pay

## 2018-01-12 ENCOUNTER — Encounter (INDEPENDENT_AMBULATORY_CARE_PROVIDER_SITE_OTHER): Payer: Self-pay | Admitting: Orthopedic Surgery

## 2018-01-12 VITALS — Ht 64.0 in | Wt 195.0 lb

## 2018-01-12 DIAGNOSIS — M25552 Pain in left hip: Secondary | ICD-10-CM | POA: Diagnosis not present

## 2018-01-12 DIAGNOSIS — M25561 Pain in right knee: Secondary | ICD-10-CM | POA: Diagnosis not present

## 2018-01-12 DIAGNOSIS — M25562 Pain in left knee: Secondary | ICD-10-CM | POA: Diagnosis not present

## 2018-01-12 DIAGNOSIS — G8929 Other chronic pain: Secondary | ICD-10-CM

## 2018-01-12 DIAGNOSIS — M79671 Pain in right foot: Secondary | ICD-10-CM

## 2018-01-13 ENCOUNTER — Encounter (INDEPENDENT_AMBULATORY_CARE_PROVIDER_SITE_OTHER): Payer: Self-pay | Admitting: Physician Assistant

## 2018-01-13 NOTE — Progress Notes (Signed)
Office Visit Note   Patient: Dana Clarke           Date of Birth: 21-Jul-1960           MRN: [id number] Visit Date: 01/12/2018              Requested by: Lovey Newcomer, PA [address] [place], [place] [address] PCP: Lovey Newcomer, [place]  Chief Complaint  Patient presents with  . Right Foot - Pain  . Left Hip - Pain  . Right Knee - Pain  . Left Knee - Pain      HPI: Patient is a 57 year old female who presents with several complaints.  First complaint is of right foot pain for the past several months.  She reports that she has pain over the ball of her right foot and feels like she stepped on something.  She reports that there is no been no history of any injury but she feels the pain under her toes is getting worse.  She also stepped into a hole on 12/12/2017 and developed abrupt onset of left hip pain and groin pain and she points to her left buttock area to indicate the area of pain.  She is also reporting bilateral knee pain which has been of gradual onset.  She reports that her mother had very bad knees and she is worried about her knees becoming worse.  She reports pain going up and down stairs and popping noises when she does range of motion or walking up and down stairs.  Assessment & Plan: Visit Diagnoses:  1. Pain in left hip   2. Pain in right foot   3. Chronic pain of both knees     Plan: Counseled patient that all of her radiographs without evidence of significant arthritic changes or acute fractures or other abnormality.  She reports having taken several different nonsteroidal anti-inflammatories and she tolerated Naprosyn the best.  We recommend she continue on Naprosyn on a twice daily basis.  She may take Tylenol for breakthrough symptoms.  She has a very tight Achilles on the right and we recommended Achilles stretching exercises 5 times daily and demonstrated how to do these to the patient.  Also recommended a stiff soled shoe like new balance or Hoka  sneakers when the patient is able to wear regular shoes.  She does report having to wear steel toe boots at work and they should also be okay as they are likely stiff soled.  Recommend follow-up in 4 weeks or sooner should she have difficulty in the interim.  Follow-Up Instructions: Return in about 4 weeks (around 02/09/2018).   Ortho Exam  Patient is alert, oriented, no adenopathy, well-dressed, normal affect, normal respiratory effort. Patient ambulates with a minimally antalgic gait on the right.  She has pain over the right third metatarsal head.Her right achilles tendon is very tight and she lacks ~10 degrees of dorsiflexion on the right . She has good pedal pulses. No signs of infection or cellulitis. No ulcers over the right foot.  Bilateral knee range of motion is at least 0-110 degrees, no instability of knees. No effusions. +crepitus patellofemoral joint.  Left hip good range of motion. Negative straight leg raise on the left. Lateral hip pain with IR of left hip.   Imaging: Xr Hip Unilat W Or W/o Pelvis 2-3 Views Left  Result Date: 01/13/2018 Left hip radiographs and [facility] pelvis shows no evidence of fracture no significant degenerative changes or other abnormality  films reviewed with Dr. Marcelino Scot Knee 1-2 Views Left  Result Date: 01/13/2018 2 views of the left knee shows mild joint space narrowing medially and minimal subchondral sclerosis but otherwise normal radiograph films were reviewed with Dr. Marcelino Scot Foot 2 Views Right  Result Date: 01/13/2018 Radiographs of the right foot are negative for acute fractures, degenerative changes there is good preservation of joint space.  Films reviewed with Dr. Lajoyce Corners.  Xr Knee 1-2 Views Right  Result Date: 01/13/2018 2 views of the right knee shows minimal medial joint space narrowing and subchondral sclerosis otherwise normal radiograph comes reviewed with Dr. Lajoyce Corners  No images are attached to the encounter.  Labs: Lab Results  Component  Value Date   REPTSTATUS 01/27/2016 FINAL 01/25/2016   CULT NO GROWTH Performed at [facility]  01/25/2016     Lab Results  Component Value Date   ALBUMIN 3.5 01/30/2016   ALBUMIN 3.7 01/29/2016   ALBUMIN 3.4 (L) 01/28/2016    Body mass index is 33.47 kg/m.  Orders:  Orders Placed This Encounter  Procedures  . XR Foot 2 Views Right  . XR HIP UNILAT W OR W/O PELVIS 2-3 VIEWS LEFT  . XR Knee 1-2 Views Right  . XR Knee 1-2 Views Left   No orders of the defined types were placed in this encounter.    Procedures: No procedures performed  Clinical Data: No additional findings.  ROS:  All other systems negative, except as noted in the HPI. Review of Systems  Objective: Vital Signs: Ht 5\' 4"  (1.626 m)   Wt 195 lb (88.5 kg)   BMI 33.47 kg/m   Specialty Comments:  No specialty comments available.  PMFS History: Patient Active Problem List   Diagnosis Date Noted  . Nausea with vomiting 04/29/2016  . Cholelithiasis 01/25/2016  . Hyperbilirubinemia 01/25/2016  . Obstructive jaundice 01/25/2016  . Abnormal LFTs 01/25/2016  . Choledocholithiasis 01/25/2016  . Biliary obstruction 01/25/2016  . RUQ pain   . GERD (gastroesophageal reflux disease) 04/13/2012  . Abdominal pain 04/13/2012  . Rectal bleed 08/20/2011  . Rectocele 08/20/2011    Class: Question of  . Constipation 08/20/2011  . Diarrhea 08/20/2011   Past Medical History:  Diagnosis Date  . Abdominal pain    chronic right sided abd pain, gallbladder w/u negative in 2009 (u/s and HIDA), repeat u/s negative in 2014, 03/2015  . Bell's palsy   . Diverticula of colon   . GERD (gastroesophageal reflux disease)   . Rectocele   . Seasonal allergies   . Stress     Family History  Problem Relation Age of Onset  . COPD Father        deceased age 57  . Heart disease Mother        deceased age 38s  . Diabetes Mother   . Cirrhosis Brother        etoh  . Bell's palsy Brother   . Stroke Brother     . Breast cancer Maternal Aunt        2 maternal great aunt.  . Breast cancer Paternal Aunt   . Colon cancer Neg Hx     Past Surgical History:  Procedure Laterality Date  . CHOLECYSTECTOMY N/A 01/29/2016   Procedure: LAPAROSCOPIC CHOLECYSTECTOMY;  Surgeon: Ancil Linsey, MD;  Location: [facility];  Service: General;  Laterality: N/A;  . COLONOSCOPY  01/2008   ZOX:WRUEAVW anal canal, pancolonic diverticula, normal terminal ileium  . ESOPHAGOGASTRODUODENOSCOPY  01/11/2008  ZOX:WRUEAV esophagus, small hiatal hernia, tiny antral erosions.  Otherwise unremarkable gastric mucosa, patent pylorus, normal D1 and D2.   Marland Kitchen SIGMOIDOSCOPY  02/12/96   interenal hemorrhoids  . TONSILLECTOMY     Social History   Occupational History    Employer: [employer]  Tobacco Use  . Smoking status: Never Smoker  . Smokeless tobacco: Never Used  Substance and Sexual Activity  . Alcohol use: No  . Drug use: No  . Sexual activity: Not on file

## 2018-02-09 ENCOUNTER — Encounter (INDEPENDENT_AMBULATORY_CARE_PROVIDER_SITE_OTHER): Payer: Self-pay | Admitting: Orthopedic Surgery

## 2018-02-09 ENCOUNTER — Ambulatory Visit (INDEPENDENT_AMBULATORY_CARE_PROVIDER_SITE_OTHER): Payer: Federal, State, Local not specified - PPO | Admitting: Physician Assistant

## 2018-02-09 VITALS — Ht 64.0 in | Wt 195.0 lb

## 2018-02-09 DIAGNOSIS — M6701 Short Achilles tendon (acquired), right ankle: Secondary | ICD-10-CM | POA: Diagnosis not present

## 2018-02-09 DIAGNOSIS — M5442 Lumbago with sciatica, left side: Secondary | ICD-10-CM | POA: Diagnosis not present

## 2018-02-09 DIAGNOSIS — G8929 Other chronic pain: Secondary | ICD-10-CM | POA: Diagnosis not present

## 2018-02-09 MED ORDER — PREDNISONE 10 MG PO TABS: 20.0000 mg | ORAL_TABLET | Freq: Every day | ORAL | 0 refills | Status: AC

## 2018-02-10 ENCOUNTER — Encounter (INDEPENDENT_AMBULATORY_CARE_PROVIDER_SITE_OTHER): Payer: Self-pay | Admitting: Physician Assistant

## 2018-02-10 NOTE — Progress Notes (Signed)
Office Visit Note   Patient: Dana Clarke           Date of Birth: [birth year]           MRN: [id number] Visit Date: 02/09/2018              Requested by: Lovey Newcomer, PA [address] [place], [place] [address] PCP: Lovey Newcomer, [place]  Chief Complaint  Patient presents with  . Right Foot - Pain  . Left Knee - Pain  . Right Knee - Pain      HPI: The patient is a 57 year old female who presents for follow-up right foot Achilles tendon contracture and low back pain.  She reports that she has been trying to do her Achilles stretches as instructed in taking Mobic but this is not improving her symptoms.  She continues to have pain over the ball of her right foot.  She reports she does wear stiff soled steel toed boots at work but she has not been wearing stiff soled shoes at home.  We discussed that it is important to wear the stiff soled shoes to help take pressure off the ball of her foot.  She is continued to have some low back pain as well and reports that she has pain radiating over the left buttock and into the left thigh.  We did discuss trying some prednisone for her sciatic symptoms and she is little anxious about this so she has been treated with high-dose steroids in the past and this made her very anxious but she is willing to try some low-dose to alleviate some of her symptoms.  We also discussed trying some physical therapy and she is willing to try this.  Assessment & Plan: Visit Diagnoses:  1. Achilles tendon contracture, right   2. Chronic left-sided low back pain with left-sided sciatica     Plan: The patient was encouraged to continue to work on Achilles stretching and the exercises were read demonstrated to the patient.  We discussed that other treatment options for this included gastrocs recession to improve her ankle range of motion and she does not desire any type of surgical intervention at this time.  We also discussed wearing stiff soled shoes when she is at  home as well as continue to wear her steel toed boots at work which are stiff soled.  She is willing to try low-dose of prednisone for her low back pain with sciatic symptoms and will get a start prednisone 20 mg daily at breakfast x1 week then decrease to 10 mg daily at breakfast x1 week then decrease to 10 mg every other day at breakfast for an additional week and then stop.  We did speak with the pharmacist as she had a reported allergy to prednisone however this was not a true allergy and she is willing to try the low-dose prednisone.  We will also go to refer to physical therapy at [facility] him and he for range of motion strengthening and treatment for her low back pain as well as her Achilles stretching.  She will follow-up here in 8 weeks or sooner should she have difficulties in the interim.  Follow-Up Instructions: Return in about 8 weeks (around 04/06/2018).   Ortho Exam  Patient is alert, oriented, no adenopathy, well-dressed, normal affect, normal respiratory effort. Right ankle range of motion is to neutral.  She has good plantar flexion.  There is no ankle or forefoot instability.  Foot is plantigrade with  weightbearing.  She has tenderness to palpation over the metatarsal heads and complains of pain over this area with weightbearing.  She is not wearing stiff soled shoes.  She has good pedal pulses.  Examination of the left back she has some minimal tenderness over the left paraspinals.  She has a weakly positive straight leg raise over the left negative over the right.  She has no focal neurologic deficit.  Imaging: No results found. No images are attached to the encounter.  Labs: Lab Results  Component Value Date   REPTSTATUS 01/27/2016 FINAL 01/25/2016   CULT NO GROWTH Performed at [facility]  01/25/2016     Lab Results  Component Value Date   ALBUMIN 3.5 01/30/2016   ALBUMIN 3.7 01/29/2016   ALBUMIN 3.4 (L) 01/28/2016    Body mass index is 33.47  kg/m.  Orders:  No orders of the defined types were placed in this encounter.  Meds ordered this encounter  Medications  . predniSONE (DELTASONE) 10 MG tablet    Sig: Take 2 tablets (20 mg total) by mouth daily with breakfast.    Dispense:  60 tablet    Refill:  0    Decrease to 10 mg daily with breakfast after 1 week if improved and then 10 mg every other day if improved after 1 more week.     Procedures: No procedures performed  Clinical Data: No additional findings.  ROS:  All other systems negative, except as noted in the HPI. Review of Systems  Objective: Vital Signs: Ht 5\' 4"  (1.626 m)   Wt 195 lb (88.5 kg)   BMI 33.47 kg/m   Specialty Comments:  No specialty comments available.  PMFS History: Patient Active Problem List   Diagnosis Date Noted  . Nausea with vomiting 04/29/2016  . Cholelithiasis 01/25/2016  . Hyperbilirubinemia 01/25/2016  . Obstructive jaundice 01/25/2016  . Abnormal LFTs 01/25/2016  . Choledocholithiasis 01/25/2016  . Biliary obstruction 01/25/2016  . RUQ pain   . GERD (gastroesophageal reflux disease) 04/13/2012  . Abdominal pain 04/13/2012  . Rectal bleed 08/20/2011  . Rectocele 08/20/2011    Class: Question of  . Constipation 08/20/2011  . Diarrhea 08/20/2011   Past Medical History:  Diagnosis Date  . Abdominal pain    chronic right sided abd pain, gallbladder w/u negative in 2009 (u/s and HIDA), repeat u/s negative in 2014, 03/2015  . Bell's palsy   . Diverticula of colon   . GERD (gastroesophageal reflux disease)   . Rectocele   . Seasonal allergies   . Stress     Family History  Problem Relation Age of Onset  . COPD Father        deceased age 33  . Heart disease Mother        deceased age 42s  . Diabetes Mother   . Cirrhosis Brother        etoh  . Bell's palsy Brother   . Stroke Brother   . Breast cancer Maternal Aunt        2 maternal great aunt.  . Breast cancer Paternal Aunt   . Colon cancer Neg Hx      Past Surgical History:  Procedure Laterality Date  . CHOLECYSTECTOMY N/A 01/29/2016   Procedure: LAPAROSCOPIC CHOLECYSTECTOMY;  Surgeon: Ancil Linsey, MD;  Location: [facility];  Service: General;  Laterality: N/A;  . COLONOSCOPY  01/2008   ZOX:WRUEAVW anal canal, pancolonic diverticula, normal terminal ileium  . ESOPHAGOGASTRODUODENOSCOPY  01/11/2008    UJW:[id number]  esophagus, small hiatal hernia, tiny antral erosions.  Otherwise unremarkable gastric mucosa, patent pylorus, normal D1 and D2.   Marland Kitchen. SIGMOIDOSCOPY  02/12/96   interenal hemorrhoids  . TONSILLECTOMY     Social History   Occupational History    Employer: [employer]  Tobacco Use  . Smoking status: Never Smoker  . Smokeless tobacco: Never Used  Substance and Sexual Activity  . Alcohol use: No  . Drug use: No  . Sexual activity: Not on file

## 2018-04-06 DIAGNOSIS — J019 Acute sinusitis, unspecified: Secondary | ICD-10-CM | POA: Diagnosis not present

## 2018-04-06 DIAGNOSIS — J069 Acute upper respiratory infection, unspecified: Secondary | ICD-10-CM | POA: Diagnosis not present

## 2018-04-06 DIAGNOSIS — Z6837 Body mass index (BMI) 37.0-37.9, adult: Secondary | ICD-10-CM | POA: Diagnosis not present

## 2018-04-13 ENCOUNTER — Ambulatory Visit (INDEPENDENT_AMBULATORY_CARE_PROVIDER_SITE_OTHER): Payer: Federal, State, Local not specified - PPO | Admitting: Orthopedic Surgery

## 2018-04-13 ENCOUNTER — Encounter (INDEPENDENT_AMBULATORY_CARE_PROVIDER_SITE_OTHER): Payer: Self-pay | Admitting: Orthopedic Surgery

## 2018-04-13 VITALS — Ht 64.0 in | Wt 195.0 lb

## 2018-04-13 DIAGNOSIS — M79671 Pain in right foot: Secondary | ICD-10-CM | POA: Diagnosis not present

## 2018-04-13 DIAGNOSIS — M6701 Short Achilles tendon (acquired), right ankle: Secondary | ICD-10-CM

## 2018-04-13 NOTE — Progress Notes (Signed)
Office Visit Note   Patient: Dana LoweBrenda T Hamlett           Date of Birth: 02/02/1961           MRN: [id number] Visit Date: 04/13/2018              Requested by: Lovey NewcomerBoyd, William S, PA [address] [place], [place] [id number] PCP: Lovey NewcomerBoyd, William S, [place]  Chief Complaint  Patient presents with  . Right Achilles Tendon - Pain      HPI: Patient is a 58 year old woman who states she still has lower back pain with pain radiating to the buttocks bilaterally.  Patient states she has difficulty getting from a sitting to a standing position.  She denies any radicular symptoms down her legs denies any weakness.  She states she does not want to take any medication for her radicular symptoms.  Patient has obtained a pair of new balance sneakers she still has pain across the forefoot on the right.  She has not been doing Achilles stretching.  Assessment & Plan: Visit Diagnoses:  1. Achilles tendon contracture, right   2. Pain in right foot     Plan: Demonstrated Achilles stretching and the importance of doing this 5 times a day a minute at a time.  Discussed that if she does not resolve the contracture with stretching surgical intervention would be an option with gastrocnemius recession as well as a Weil osteotomy for toes 2 3 and 4.  Follow-Up Instructions: Return if symptoms worsen or fail to improve.   Ortho Exam  Patient is alert, oriented, no adenopathy, well-dressed, normal affect, normal respiratory effort. Examination patient has callus beneath the forefoot he is maximally tender to palpation beneath the second metatarsal head.  She has clawing of toes 2 3 and 4 she has a palpable pulse.  With her knee extended she has dorsiflexion 10 degrees short of neutral with significant Achilles contracture.  With her knee flexed she has dorsiflexion of 20 degrees past neutral.  Imaging: No results found. No images are attached to the encounter.  Labs: Lab Results  Component Value Date   REPTSTATUS  01/27/2016 FINAL 01/25/2016   CULT NO GR[facility]TH [facility]  01/25/2016     Lab Results  Component Value Date   ALBUMIN 3.5 01/30/2016   ALBUMIN 3.7 01/29/2016   ALBUMIN 3.4 (L) 01/28/2016    Body mass index is 33.47 kg/m.  Orders:  No orders of the defined types were placed in this encounter.  No orders of the defined types were placed in this encounter.    Procedures: No procedures performed  Clinical Data: No additional findings.  ROS:  All other systems negative, except as noted in the HPI. Review of Systems  Objective: Vital Signs: Ht 5\' 4"  (1.626 m)   Wt 195 lb (88.5 kg)   BMI 33.47 kg/m   Specialty Comments:  No specialty comments available.  PMFS History: Patient Active Problem List   Diagnosis Date Noted  . Nausea with vomiting 04/29/2016  . Cholelithiasis 01/25/2016  . Hyperbilirubinemia 01/25/2016  . Obstructive jaundice 01/25/2016  . Abnormal LFTs 01/25/2016  . Choledocholithiasis 01/25/2016  . Biliary obstruction 01/25/2016  . RUQ pain   . GERD (gastroesophageal reflux disease) 04/13/2012  . Abdominal pain 04/13/2012  . Rectal bleed 08/20/2011  . Rectocele 08/20/2011    Class: Question of  . Constipation 08/20/2011  . Diarrhea 08/20/2011   Past Medical History:  Diagnosis Date  . Abdominal  pain    chronic right sided abd pain, gallbladder w/u negative in 2009 (u/s and HIDA), repeat u/s negative in 2014, 03/2015  . Bell's palsy   . Diverticula of colon   . GERD (gastroesophageal reflux disease)   . Rectocele   . Seasonal allergies   . Stress     Family History  Problem Relation Age of Onset  . COPD Father        deceased age 10  . Heart disease Mother        deceased age 29s  . Diabetes Mother   . Cirrhosis Brother        etoh  . Bell's palsy Brother   . Stroke Brother   . Breast cancer Maternal Aunt        2 maternal great aunt.  . Breast cancer Paternal Aunt   . Colon cancer Neg Hx     Past  Surgical History:  Procedure Laterality Date  . CHOLECYSTECTOMY N/A 01/29/2016   Procedure: LAPAROSCOPIC CHOLECYSTECTOMY;  Surgeon: Ancil Linsey, MD;  Location: [facility];  Service: General;  Laterality: N/A;  . COLONOSCOPY  01/2008   YOV:ZCHYIFO anal canal, pancolonic diverticula, normal terminal ileium  . ESOPHAGOGASTRODUODENOSCOPY  01/11/2008    YDX:AJOINO esophagus, small hiatal hernia, tiny antral erosions.  Otherwise unremarkable gastric mucosa, patent pylorus, normal D1 and D2.   Marland Kitchen SIGMOIDOSCOPY  02/12/96   interenal hemorrhoids  . TONSILLECTOMY     Social History   Occupational History    Employer: [employer]  Tobacco Use  . Smoking status: Never Smoker  . Smokeless tobacco: Never Used  Substance and Sexual Activity  . Alcohol use: No  . Drug use: No  . Sexual activity: Not on file

## 2018-12-01 ENCOUNTER — Other Ambulatory Visit: Payer: Self-pay

## 2018-12-01 DIAGNOSIS — Z20822 Contact with and (suspected) exposure to covid-19: Secondary | ICD-10-CM

## 2018-12-01 DIAGNOSIS — R6889 Other general symptoms and signs: Secondary | ICD-10-CM | POA: Diagnosis not present

## 2018-12-02 LAB — NOVEL CORONAVIRUS, NAA: SARS-CoV-2, NAA: NOT DETECTED

## 2018-12-08 DIAGNOSIS — L814 Other melanin hyperpigmentation: Secondary | ICD-10-CM | POA: Diagnosis not present

## 2018-12-08 DIAGNOSIS — D1801 Hemangioma of skin and subcutaneous tissue: Secondary | ICD-10-CM | POA: Diagnosis not present

## 2018-12-08 DIAGNOSIS — L570 Actinic keratosis: Secondary | ICD-10-CM | POA: Diagnosis not present

## 2018-12-08 DIAGNOSIS — L821 Other seborrheic keratosis: Secondary | ICD-10-CM | POA: Diagnosis not present

## 2018-12-08 DIAGNOSIS — L820 Inflamed seborrheic keratosis: Secondary | ICD-10-CM | POA: Diagnosis not present

## 2018-12-08 DIAGNOSIS — D225 Melanocytic nevi of trunk: Secondary | ICD-10-CM | POA: Diagnosis not present

## 2019-01-07 DIAGNOSIS — Z23 Encounter for immunization: Secondary | ICD-10-CM | POA: Diagnosis not present

## 2019-01-30 DIAGNOSIS — Z6837 Body mass index (BMI) 37.0-37.9, adult: Secondary | ICD-10-CM | POA: Diagnosis not present

## 2019-01-30 DIAGNOSIS — M25511 Pain in right shoulder: Secondary | ICD-10-CM | POA: Diagnosis not present

## 2019-01-30 DIAGNOSIS — G4700 Insomnia, unspecified: Secondary | ICD-10-CM | POA: Diagnosis not present

## 2019-01-30 DIAGNOSIS — M25512 Pain in left shoulder: Secondary | ICD-10-CM | POA: Diagnosis not present

## 2019-01-30 DIAGNOSIS — R739 Hyperglycemia, unspecified: Secondary | ICD-10-CM | POA: Diagnosis not present

## 2019-03-09 DIAGNOSIS — Z23 Encounter for immunization: Secondary | ICD-10-CM | POA: Diagnosis not present

## 2019-08-03 DIAGNOSIS — M545 Low back pain: Secondary | ICD-10-CM | POA: Diagnosis not present

## 2019-08-03 DIAGNOSIS — M25512 Pain in left shoulder: Secondary | ICD-10-CM | POA: Diagnosis not present

## 2019-08-03 DIAGNOSIS — M25511 Pain in right shoulder: Secondary | ICD-10-CM | POA: Diagnosis not present

## 2019-08-03 DIAGNOSIS — M25552 Pain in left hip: Secondary | ICD-10-CM | POA: Diagnosis not present

## 2019-08-17 DIAGNOSIS — M25512 Pain in left shoulder: Secondary | ICD-10-CM | POA: Diagnosis not present

## 2019-08-17 DIAGNOSIS — G8929 Other chronic pain: Secondary | ICD-10-CM | POA: Diagnosis not present

## 2019-08-17 DIAGNOSIS — M79645 Pain in left finger(s): Secondary | ICD-10-CM | POA: Diagnosis not present

## 2019-08-17 DIAGNOSIS — M79644 Pain in right finger(s): Secondary | ICD-10-CM | POA: Diagnosis not present

## 2019-08-17 DIAGNOSIS — M25511 Pain in right shoulder: Secondary | ICD-10-CM | POA: Diagnosis not present

## 2019-08-24 DIAGNOSIS — M79644 Pain in right finger(s): Secondary | ICD-10-CM | POA: Diagnosis not present

## 2019-08-24 DIAGNOSIS — M25511 Pain in right shoulder: Secondary | ICD-10-CM | POA: Diagnosis not present

## 2019-08-24 DIAGNOSIS — M79645 Pain in left finger(s): Secondary | ICD-10-CM | POA: Diagnosis not present

## 2019-08-24 DIAGNOSIS — G8929 Other chronic pain: Secondary | ICD-10-CM | POA: Diagnosis not present

## 2019-08-24 DIAGNOSIS — M25512 Pain in left shoulder: Secondary | ICD-10-CM | POA: Diagnosis not present

## 2019-08-26 DIAGNOSIS — M25512 Pain in left shoulder: Secondary | ICD-10-CM | POA: Diagnosis not present

## 2019-08-26 DIAGNOSIS — M79645 Pain in left finger(s): Secondary | ICD-10-CM | POA: Diagnosis not present

## 2019-08-26 DIAGNOSIS — M79644 Pain in right finger(s): Secondary | ICD-10-CM | POA: Diagnosis not present

## 2019-08-26 DIAGNOSIS — M25511 Pain in right shoulder: Secondary | ICD-10-CM | POA: Diagnosis not present

## 2019-08-26 DIAGNOSIS — G8929 Other chronic pain: Secondary | ICD-10-CM | POA: Diagnosis not present

## 2019-08-31 DIAGNOSIS — M25512 Pain in left shoulder: Secondary | ICD-10-CM | POA: Diagnosis not present

## 2019-08-31 DIAGNOSIS — M79644 Pain in right finger(s): Secondary | ICD-10-CM | POA: Diagnosis not present

## 2019-08-31 DIAGNOSIS — M25511 Pain in right shoulder: Secondary | ICD-10-CM | POA: Diagnosis not present

## 2019-08-31 DIAGNOSIS — M79645 Pain in left finger(s): Secondary | ICD-10-CM | POA: Diagnosis not present

## 2019-08-31 DIAGNOSIS — G8929 Other chronic pain: Secondary | ICD-10-CM | POA: Diagnosis not present

## 2019-09-02 DIAGNOSIS — M25512 Pain in left shoulder: Secondary | ICD-10-CM | POA: Diagnosis not present

## 2019-09-02 DIAGNOSIS — M79644 Pain in right finger(s): Secondary | ICD-10-CM | POA: Diagnosis not present

## 2019-09-02 DIAGNOSIS — M25511 Pain in right shoulder: Secondary | ICD-10-CM | POA: Diagnosis not present

## 2019-09-02 DIAGNOSIS — M79645 Pain in left finger(s): Secondary | ICD-10-CM | POA: Diagnosis not present

## 2019-09-02 DIAGNOSIS — G8929 Other chronic pain: Secondary | ICD-10-CM | POA: Diagnosis not present

## 2019-09-09 DIAGNOSIS — M25511 Pain in right shoulder: Secondary | ICD-10-CM | POA: Diagnosis not present

## 2019-09-09 DIAGNOSIS — M25512 Pain in left shoulder: Secondary | ICD-10-CM | POA: Diagnosis not present

## 2019-09-09 DIAGNOSIS — G8929 Other chronic pain: Secondary | ICD-10-CM | POA: Diagnosis not present

## 2019-09-09 DIAGNOSIS — M79645 Pain in left finger(s): Secondary | ICD-10-CM | POA: Diagnosis not present

## 2019-09-09 DIAGNOSIS — M79644 Pain in right finger(s): Secondary | ICD-10-CM | POA: Diagnosis not present

## 2019-09-10 DIAGNOSIS — M25512 Pain in left shoulder: Secondary | ICD-10-CM | POA: Diagnosis not present

## 2019-09-10 DIAGNOSIS — S39012A Strain of muscle, fascia and tendon of lower back, initial encounter: Secondary | ICD-10-CM | POA: Diagnosis not present

## 2019-09-10 DIAGNOSIS — M549 Dorsalgia, unspecified: Secondary | ICD-10-CM | POA: Diagnosis not present

## 2019-09-10 DIAGNOSIS — M25552 Pain in left hip: Secondary | ICD-10-CM | POA: Diagnosis not present

## 2019-09-14 DIAGNOSIS — M25511 Pain in right shoulder: Secondary | ICD-10-CM | POA: Diagnosis not present

## 2019-09-14 DIAGNOSIS — M79644 Pain in right finger(s): Secondary | ICD-10-CM | POA: Diagnosis not present

## 2019-09-14 DIAGNOSIS — M79645 Pain in left finger(s): Secondary | ICD-10-CM | POA: Diagnosis not present

## 2019-09-14 DIAGNOSIS — M25512 Pain in left shoulder: Secondary | ICD-10-CM | POA: Diagnosis not present

## 2019-09-14 DIAGNOSIS — G8929 Other chronic pain: Secondary | ICD-10-CM | POA: Diagnosis not present

## 2019-09-16 DIAGNOSIS — M25512 Pain in left shoulder: Secondary | ICD-10-CM | POA: Diagnosis not present

## 2019-09-20 DIAGNOSIS — R519 Headache, unspecified: Secondary | ICD-10-CM | POA: Diagnosis not present

## 2019-09-20 DIAGNOSIS — M549 Dorsalgia, unspecified: Secondary | ICD-10-CM | POA: Diagnosis not present

## 2019-09-20 DIAGNOSIS — M545 Low back pain: Secondary | ICD-10-CM | POA: Diagnosis not present

## 2019-09-20 DIAGNOSIS — M542 Cervicalgia: Secondary | ICD-10-CM | POA: Diagnosis not present

## 2019-10-29 DIAGNOSIS — S134XXA Sprain of ligaments of cervical spine, initial encounter: Secondary | ICD-10-CM | POA: Diagnosis not present

## 2019-10-29 DIAGNOSIS — S199XXA Unspecified injury of neck, initial encounter: Secondary | ICD-10-CM | POA: Diagnosis not present

## 2019-10-29 DIAGNOSIS — M542 Cervicalgia: Secondary | ICD-10-CM | POA: Diagnosis not present

## 2019-10-29 DIAGNOSIS — Z6830 Body mass index (BMI) 30.0-30.9, adult: Secondary | ICD-10-CM | POA: Diagnosis not present

## 2019-10-29 DIAGNOSIS — M549 Dorsalgia, unspecified: Secondary | ICD-10-CM | POA: Diagnosis not present

## 2019-11-18 DIAGNOSIS — Z20828 Contact with and (suspected) exposure to other viral communicable diseases: Secondary | ICD-10-CM | POA: Diagnosis not present

## 2019-11-18 DIAGNOSIS — R197 Diarrhea, unspecified: Secondary | ICD-10-CM | POA: Diagnosis not present

## 2019-11-23 DIAGNOSIS — M5417 Radiculopathy, lumbosacral region: Secondary | ICD-10-CM | POA: Diagnosis not present

## 2019-11-23 DIAGNOSIS — G5603 Carpal tunnel syndrome, bilateral upper limbs: Secondary | ICD-10-CM | POA: Diagnosis not present

## 2019-11-23 DIAGNOSIS — R201 Hypoesthesia of skin: Secondary | ICD-10-CM | POA: Diagnosis not present

## 2019-11-23 DIAGNOSIS — M5412 Radiculopathy, cervical region: Secondary | ICD-10-CM | POA: Diagnosis not present

## 2019-11-23 DIAGNOSIS — R519 Headache, unspecified: Secondary | ICD-10-CM | POA: Diagnosis not present

## 2019-11-23 DIAGNOSIS — M5481 Occipital neuralgia: Secondary | ICD-10-CM | POA: Diagnosis not present

## 2019-12-07 DIAGNOSIS — M542 Cervicalgia: Secondary | ICD-10-CM | POA: Diagnosis not present

## 2019-12-07 DIAGNOSIS — M79672 Pain in left foot: Secondary | ICD-10-CM | POA: Diagnosis not present

## 2019-12-07 DIAGNOSIS — G5603 Carpal tunnel syndrome, bilateral upper limbs: Secondary | ICD-10-CM | POA: Diagnosis not present

## 2019-12-07 DIAGNOSIS — M5481 Occipital neuralgia: Secondary | ICD-10-CM | POA: Diagnosis not present

## 2019-12-07 DIAGNOSIS — F0781 Postconcussional syndrome: Secondary | ICD-10-CM | POA: Diagnosis not present

## 2019-12-23 DIAGNOSIS — K760 Fatty (change of) liver, not elsewhere classified: Secondary | ICD-10-CM | POA: Diagnosis not present

## 2019-12-23 DIAGNOSIS — I700 Atherosclerosis of aorta: Secondary | ICD-10-CM | POA: Diagnosis not present

## 2020-02-10 DIAGNOSIS — Z800 Family history of malignant neoplasm of digestive organs: Secondary | ICD-10-CM | POA: Diagnosis not present

## 2020-02-10 DIAGNOSIS — Z803 Family history of malignant neoplasm of breast: Secondary | ICD-10-CM | POA: Diagnosis not present

## 2020-02-10 DIAGNOSIS — Z01419 Encounter for gynecological examination (general) (routine) without abnormal findings: Secondary | ICD-10-CM | POA: Diagnosis not present

## 2020-02-10 DIAGNOSIS — Z6832 Body mass index (BMI) 32.0-32.9, adult: Secondary | ICD-10-CM | POA: Diagnosis not present

## 2020-03-14 DIAGNOSIS — R0981 Nasal congestion: Secondary | ICD-10-CM | POA: Diagnosis not present

## 2020-03-14 DIAGNOSIS — J029 Acute pharyngitis, unspecified: Secondary | ICD-10-CM | POA: Diagnosis not present

## 2020-03-14 DIAGNOSIS — Z7189 Other specified counseling: Secondary | ICD-10-CM | POA: Diagnosis not present

## 2020-03-14 DIAGNOSIS — J020 Streptococcal pharyngitis: Secondary | ICD-10-CM | POA: Diagnosis not present

## 2020-05-27 DIAGNOSIS — N3001 Acute cystitis with hematuria: Secondary | ICD-10-CM | POA: Diagnosis not present

## 2020-05-27 DIAGNOSIS — R319 Hematuria, unspecified: Secondary | ICD-10-CM | POA: Diagnosis not present

## 2020-06-12 DIAGNOSIS — L570 Actinic keratosis: Secondary | ICD-10-CM | POA: Diagnosis not present

## 2020-07-03 DIAGNOSIS — Z1231 Encounter for screening mammogram for malignant neoplasm of breast: Secondary | ICD-10-CM | POA: Diagnosis not present

## 2020-08-29 DIAGNOSIS — H16223 Keratoconjunctivitis sicca, not specified as Sjogren's, bilateral: Secondary | ICD-10-CM | POA: Diagnosis not present

## 2020-08-29 DIAGNOSIS — H2513 Age-related nuclear cataract, bilateral: Secondary | ICD-10-CM | POA: Diagnosis not present

## 2020-08-29 DIAGNOSIS — D492 Neoplasm of unspecified behavior of bone, soft tissue, and skin: Secondary | ICD-10-CM | POA: Diagnosis not present

## 2020-10-17 DIAGNOSIS — M7062 Trochanteric bursitis, left hip: Secondary | ICD-10-CM | POA: Diagnosis not present

## 2020-10-17 DIAGNOSIS — M25552 Pain in left hip: Secondary | ICD-10-CM | POA: Diagnosis not present

## 2020-11-01 DIAGNOSIS — R635 Abnormal weight gain: Secondary | ICD-10-CM | POA: Diagnosis not present

## 2020-11-01 DIAGNOSIS — K760 Fatty (change of) liver, not elsewhere classified: Secondary | ICD-10-CM | POA: Diagnosis not present

## 2020-11-07 DIAGNOSIS — M25462 Effusion, left knee: Secondary | ICD-10-CM | POA: Diagnosis not present

## 2020-11-07 DIAGNOSIS — M25562 Pain in left knee: Secondary | ICD-10-CM | POA: Diagnosis not present

## 2020-11-09 DIAGNOSIS — M25552 Pain in left hip: Secondary | ICD-10-CM | POA: Diagnosis not present

## 2020-11-23 DIAGNOSIS — M1612 Unilateral primary osteoarthritis, left hip: Secondary | ICD-10-CM | POA: Diagnosis not present

## 2020-11-23 DIAGNOSIS — M25562 Pain in left knee: Secondary | ICD-10-CM | POA: Diagnosis not present

## 2020-11-23 DIAGNOSIS — M25552 Pain in left hip: Secondary | ICD-10-CM | POA: Diagnosis not present

## 2020-12-04 DIAGNOSIS — M545 Low back pain, unspecified: Secondary | ICD-10-CM | POA: Diagnosis not present

## 2020-12-21 DIAGNOSIS — M1712 Unilateral primary osteoarthritis, left knee: Secondary | ICD-10-CM | POA: Diagnosis not present

## 2020-12-21 DIAGNOSIS — M7062 Trochanteric bursitis, left hip: Secondary | ICD-10-CM | POA: Diagnosis not present

## 2020-12-28 DIAGNOSIS — M25462 Effusion, left knee: Secondary | ICD-10-CM | POA: Diagnosis not present

## 2020-12-28 DIAGNOSIS — M1712 Unilateral primary osteoarthritis, left knee: Secondary | ICD-10-CM | POA: Diagnosis not present

## 2021-01-04 DIAGNOSIS — M1712 Unilateral primary osteoarthritis, left knee: Secondary | ICD-10-CM | POA: Diagnosis not present

## 2021-02-10 LAB — COLOGUARD

## 2021-02-21 LAB — COLOGUARD

## 2021-11-02 DIAGNOSIS — J029 Acute pharyngitis, unspecified: Secondary | ICD-10-CM | POA: Diagnosis not present

## 2021-11-02 DIAGNOSIS — H66001 Acute suppurative otitis media without spontaneous rupture of ear drum, right ear: Secondary | ICD-10-CM | POA: Diagnosis not present

## 2021-11-02 DIAGNOSIS — J302 Other seasonal allergic rhinitis: Secondary | ICD-10-CM | POA: Diagnosis not present

## 2022-04-10 DIAGNOSIS — E669 Obesity, unspecified: Secondary | ICD-10-CM | POA: Diagnosis not present

## 2022-04-10 DIAGNOSIS — K760 Fatty (change of) liver, not elsewhere classified: Secondary | ICD-10-CM | POA: Diagnosis not present

## 2022-04-10 DIAGNOSIS — K219 Gastro-esophageal reflux disease without esophagitis: Secondary | ICD-10-CM | POA: Diagnosis not present

## 2022-04-10 DIAGNOSIS — I700 Atherosclerosis of aorta: Secondary | ICD-10-CM | POA: Diagnosis not present

## 2022-04-10 DIAGNOSIS — J302 Other seasonal allergic rhinitis: Secondary | ICD-10-CM | POA: Diagnosis not present

## 2022-04-10 DIAGNOSIS — M5090 Cervical disc disorder, unspecified, unspecified cervical region: Secondary | ICD-10-CM | POA: Diagnosis not present

## 2022-04-10 DIAGNOSIS — K5790 Diverticulosis of intestine, part unspecified, without perforation or abscess without bleeding: Secondary | ICD-10-CM | POA: Diagnosis not present

## 2022-06-17 DIAGNOSIS — D239 Other benign neoplasm of skin, unspecified: Secondary | ICD-10-CM | POA: Diagnosis not present

## 2022-06-17 DIAGNOSIS — Z1283 Encounter for screening for malignant neoplasm of skin: Secondary | ICD-10-CM | POA: Diagnosis not present

## 2022-06-17 DIAGNOSIS — L570 Actinic keratosis: Secondary | ICD-10-CM | POA: Diagnosis not present

## 2022-06-17 DIAGNOSIS — L719 Rosacea, unspecified: Secondary | ICD-10-CM | POA: Diagnosis not present

## 2022-06-21 DIAGNOSIS — Z1231 Encounter for screening mammogram for malignant neoplasm of breast: Secondary | ICD-10-CM | POA: Diagnosis not present

## 2022-08-28 DIAGNOSIS — E669 Obesity, unspecified: Secondary | ICD-10-CM | POA: Diagnosis not present

## 2022-08-28 DIAGNOSIS — I700 Atherosclerosis of aorta: Secondary | ICD-10-CM | POA: Diagnosis not present

## 2022-08-28 DIAGNOSIS — K760 Fatty (change of) liver, not elsewhere classified: Secondary | ICD-10-CM | POA: Diagnosis not present

## 2022-09-04 DIAGNOSIS — R519 Headache, unspecified: Secondary | ICD-10-CM | POA: Diagnosis not present

## 2022-09-04 DIAGNOSIS — H40013 Open angle with borderline findings, low risk, bilateral: Secondary | ICD-10-CM | POA: Diagnosis not present

## 2022-09-04 DIAGNOSIS — H5310 Unspecified subjective visual disturbances: Secondary | ICD-10-CM | POA: Diagnosis not present

## 2022-10-07 DIAGNOSIS — I700 Atherosclerosis of aorta: Secondary | ICD-10-CM | POA: Diagnosis not present

## 2022-10-07 DIAGNOSIS — K760 Fatty (change of) liver, not elsewhere classified: Secondary | ICD-10-CM | POA: Diagnosis not present

## 2022-10-07 DIAGNOSIS — E669 Obesity, unspecified: Secondary | ICD-10-CM | POA: Diagnosis not present

## 2022-10-07 DIAGNOSIS — M5090 Cervical disc disorder, unspecified, unspecified cervical region: Secondary | ICD-10-CM | POA: Diagnosis not present

## 2022-10-07 DIAGNOSIS — K219 Gastro-esophageal reflux disease without esophagitis: Secondary | ICD-10-CM | POA: Diagnosis not present

## 2022-10-07 DIAGNOSIS — J302 Other seasonal allergic rhinitis: Secondary | ICD-10-CM | POA: Diagnosis not present

## 2022-10-07 DIAGNOSIS — K5790 Diverticulosis of intestine, part unspecified, without perforation or abscess without bleeding: Secondary | ICD-10-CM | POA: Diagnosis not present

## 2022-10-07 DIAGNOSIS — R635 Abnormal weight gain: Secondary | ICD-10-CM | POA: Diagnosis not present

## 2022-11-19 DIAGNOSIS — E669 Obesity, unspecified: Secondary | ICD-10-CM | POA: Diagnosis not present

## 2022-11-19 DIAGNOSIS — I700 Atherosclerosis of aorta: Secondary | ICD-10-CM | POA: Diagnosis not present

## 2022-11-19 DIAGNOSIS — K760 Fatty (change of) liver, not elsewhere classified: Secondary | ICD-10-CM | POA: Diagnosis not present

## 2023-01-31 DIAGNOSIS — M7918 Myalgia, other site: Secondary | ICD-10-CM | POA: Diagnosis not present

## 2023-01-31 DIAGNOSIS — M2550 Pain in unspecified joint: Secondary | ICD-10-CM | POA: Diagnosis not present

## 2023-01-31 DIAGNOSIS — M791 Myalgia, unspecified site: Secondary | ICD-10-CM | POA: Diagnosis not present

## 2023-01-31 DIAGNOSIS — R490 Dysphonia: Secondary | ICD-10-CM | POA: Diagnosis not present

## 2023-01-31 DIAGNOSIS — R590 Localized enlarged lymph nodes: Secondary | ICD-10-CM | POA: Diagnosis not present

## 2023-01-31 DIAGNOSIS — D7589 Other specified diseases of blood and blood-forming organs: Secondary | ICD-10-CM | POA: Diagnosis not present

## 2023-02-03 ENCOUNTER — Other Ambulatory Visit (HOSPITAL_COMMUNITY): Payer: Self-pay | Admitting: Adult Health

## 2023-02-03 DIAGNOSIS — R490 Dysphonia: Secondary | ICD-10-CM

## 2023-02-03 DIAGNOSIS — R590 Localized enlarged lymph nodes: Secondary | ICD-10-CM

## 2023-02-04 ENCOUNTER — Ambulatory Visit (HOSPITAL_COMMUNITY)
Admission: RE | Admit: 2023-02-04 | Discharge: 2023-02-04 | Disposition: A | Discharge status: Home / Self Care | Payer: Federal, State, Local not specified - PPO | Source: Ambulatory Visit | Attending: Adult Health | Admitting: Adult Health

## 2023-02-04 DIAGNOSIS — R590 Localized enlarged lymph nodes: Secondary | ICD-10-CM | POA: Insufficient documentation

## 2023-02-04 DIAGNOSIS — M47812 Spondylosis without myelopathy or radiculopathy, cervical region: Secondary | ICD-10-CM | POA: Diagnosis not present

## 2023-02-04 DIAGNOSIS — R490 Dysphonia: Secondary | ICD-10-CM | POA: Insufficient documentation

## 2023-02-04 MED ORDER — IOHEXOL 300 MG/ML  SOLN
75.0000 mL | Freq: Once | INTRAMUSCULAR | Status: AC | PRN
  Administered 2023-02-04: 75 mL via INTRAVENOUS

## 2023-02-05 DIAGNOSIS — C01 Malignant neoplasm of base of tongue: Secondary | ICD-10-CM | POA: Diagnosis not present

## 2023-02-12 DIAGNOSIS — R221 Localized swelling, mass and lump, neck: Secondary | ICD-10-CM | POA: Diagnosis not present

## 2023-02-12 DIAGNOSIS — C01 Malignant neoplasm of base of tongue: Secondary | ICD-10-CM | POA: Diagnosis not present

## 2023-02-12 DIAGNOSIS — D490 Neoplasm of unspecified behavior of digestive system: Secondary | ICD-10-CM | POA: Diagnosis not present

## 2023-02-12 DIAGNOSIS — R490 Dysphonia: Secondary | ICD-10-CM | POA: Diagnosis not present

## 2023-02-17 DIAGNOSIS — Z13820 Encounter for screening for osteoporosis: Secondary | ICD-10-CM | POA: Diagnosis not present

## 2023-02-17 DIAGNOSIS — Z780 Asymptomatic menopausal state: Secondary | ICD-10-CM | POA: Diagnosis not present

## 2023-02-17 DIAGNOSIS — Z01419 Encounter for gynecological examination (general) (routine) without abnormal findings: Secondary | ICD-10-CM | POA: Diagnosis not present

## 2023-02-19 DIAGNOSIS — C8201 Follicular lymphoma grade I, lymph nodes of head, face, and neck: Secondary | ICD-10-CM | POA: Diagnosis not present

## 2023-02-19 DIAGNOSIS — D490 Neoplasm of unspecified behavior of digestive system: Secondary | ICD-10-CM | POA: Diagnosis not present

## 2023-02-20 DIAGNOSIS — C01 Malignant neoplasm of base of tongue: Secondary | ICD-10-CM | POA: Diagnosis not present

## 2023-02-20 DIAGNOSIS — I700 Atherosclerosis of aorta: Secondary | ICD-10-CM | POA: Diagnosis not present

## 2023-02-20 DIAGNOSIS — E669 Obesity, unspecified: Secondary | ICD-10-CM | POA: Diagnosis not present

## 2023-03-11 DIAGNOSIS — C8208 Follicular lymphoma grade I, lymph nodes of multiple sites: Secondary | ICD-10-CM | POA: Diagnosis not present

## 2023-03-11 DIAGNOSIS — C8591 Non-Hodgkin lymphoma, unspecified, lymph nodes of head, face, and neck: Secondary | ICD-10-CM | POA: Diagnosis not present

## 2023-03-24 DIAGNOSIS — D7589 Other specified diseases of blood and blood-forming organs: Secondary | ICD-10-CM | POA: Diagnosis not present

## 2023-03-24 DIAGNOSIS — E669 Obesity, unspecified: Secondary | ICD-10-CM | POA: Diagnosis not present

## 2023-03-24 DIAGNOSIS — I700 Atherosclerosis of aorta: Secondary | ICD-10-CM | POA: Diagnosis not present

## 2023-03-24 DIAGNOSIS — K760 Fatty (change of) liver, not elsewhere classified: Secondary | ICD-10-CM | POA: Diagnosis not present

## 2023-03-24 DIAGNOSIS — C8291 Follicular lymphoma, unspecified, lymph nodes of head, face, and neck: Secondary | ICD-10-CM | POA: Diagnosis not present

## 2023-03-24 DIAGNOSIS — Z23 Encounter for immunization: Secondary | ICD-10-CM | POA: Diagnosis not present

## 2023-03-28 DIAGNOSIS — R040 Epistaxis: Secondary | ICD-10-CM | POA: Diagnosis not present

## 2023-04-01 DIAGNOSIS — N289 Disorder of kidney and ureter, unspecified: Secondary | ICD-10-CM | POA: Diagnosis not present

## 2023-04-01 DIAGNOSIS — C8208 Follicular lymphoma grade I, lymph nodes of multiple sites: Secondary | ICD-10-CM | POA: Diagnosis not present

## 2023-04-01 DIAGNOSIS — R590 Localized enlarged lymph nodes: Secondary | ICD-10-CM | POA: Diagnosis not present

## 2023-04-10 DIAGNOSIS — C8208 Follicular lymphoma grade I, lymph nodes of multiple sites: Secondary | ICD-10-CM | POA: Diagnosis not present

## 2023-04-10 DIAGNOSIS — Z006 Encounter for examination for normal comparison and control in clinical research program: Secondary | ICD-10-CM | POA: Diagnosis not present

## 2023-04-10 DIAGNOSIS — D490 Neoplasm of unspecified behavior of digestive system: Secondary | ICD-10-CM | POA: Diagnosis not present

## 2023-04-15 DIAGNOSIS — Z780 Asymptomatic menopausal state: Secondary | ICD-10-CM | POA: Diagnosis not present

## 2023-04-15 DIAGNOSIS — Z13820 Encounter for screening for osteoporosis: Secondary | ICD-10-CM | POA: Diagnosis not present

## 2023-04-15 DIAGNOSIS — M85852 Other specified disorders of bone density and structure, left thigh: Secondary | ICD-10-CM | POA: Diagnosis not present

## 2023-04-24 DIAGNOSIS — C8208 Follicular lymphoma grade I, lymph nodes of multiple sites: Secondary | ICD-10-CM | POA: Diagnosis not present

## 2023-04-24 DIAGNOSIS — C8211 Follicular lymphoma grade II, lymph nodes of head, face, and neck: Secondary | ICD-10-CM | POA: Diagnosis not present

## 2023-04-24 DIAGNOSIS — Z5112 Encounter for antineoplastic immunotherapy: Secondary | ICD-10-CM | POA: Diagnosis not present

## 2023-04-24 DIAGNOSIS — D490 Neoplasm of unspecified behavior of digestive system: Secondary | ICD-10-CM | POA: Diagnosis not present

## 2023-04-24 DIAGNOSIS — Z006 Encounter for examination for normal comparison and control in clinical research program: Secondary | ICD-10-CM | POA: Diagnosis not present

## 2023-05-08 DIAGNOSIS — C8208 Follicular lymphoma grade I, lymph nodes of multiple sites: Secondary | ICD-10-CM | POA: Diagnosis not present

## 2023-05-08 DIAGNOSIS — Z006 Encounter for examination for normal comparison and control in clinical research program: Secondary | ICD-10-CM | POA: Diagnosis not present

## 2023-05-08 DIAGNOSIS — C8211 Follicular lymphoma grade II, lymph nodes of head, face, and neck: Secondary | ICD-10-CM | POA: Diagnosis not present

## 2023-05-08 DIAGNOSIS — Z5112 Encounter for antineoplastic immunotherapy: Secondary | ICD-10-CM | POA: Diagnosis not present

## 2023-05-08 DIAGNOSIS — D490 Neoplasm of unspecified behavior of digestive system: Secondary | ICD-10-CM | POA: Diagnosis not present

## 2023-05-09 DIAGNOSIS — C8208 Follicular lymphoma grade I, lymph nodes of multiple sites: Secondary | ICD-10-CM | POA: Diagnosis not present

## 2023-05-15 DIAGNOSIS — C8208 Follicular lymphoma grade I, lymph nodes of multiple sites: Secondary | ICD-10-CM | POA: Diagnosis not present

## 2023-05-15 DIAGNOSIS — C8219 Follicular lymphoma grade II, extranodal and solid organ sites: Secondary | ICD-10-CM | POA: Diagnosis not present

## 2023-05-15 DIAGNOSIS — D490 Neoplasm of unspecified behavior of digestive system: Secondary | ICD-10-CM | POA: Diagnosis not present

## 2023-05-15 DIAGNOSIS — C8290 Follicular lymphoma, unspecified, unspecified site: Secondary | ICD-10-CM | POA: Diagnosis not present

## 2023-05-15 DIAGNOSIS — Z5112 Encounter for antineoplastic immunotherapy: Secondary | ICD-10-CM | POA: Diagnosis not present

## 2023-05-15 DIAGNOSIS — Z006 Encounter for examination for normal comparison and control in clinical research program: Secondary | ICD-10-CM | POA: Diagnosis not present

## 2023-05-22 DIAGNOSIS — D490 Neoplasm of unspecified behavior of digestive system: Secondary | ICD-10-CM | POA: Diagnosis not present

## 2023-05-22 DIAGNOSIS — C8219 Follicular lymphoma grade II, extranodal and solid organ sites: Secondary | ICD-10-CM | POA: Diagnosis not present

## 2023-05-22 DIAGNOSIS — C8208 Follicular lymphoma grade I, lymph nodes of multiple sites: Secondary | ICD-10-CM | POA: Diagnosis not present

## 2023-05-22 DIAGNOSIS — Z5112 Encounter for antineoplastic immunotherapy: Secondary | ICD-10-CM | POA: Diagnosis not present

## 2023-05-23 DIAGNOSIS — R001 Bradycardia, unspecified: Secondary | ICD-10-CM | POA: Diagnosis not present

## 2023-05-23 DIAGNOSIS — R079 Chest pain, unspecified: Secondary | ICD-10-CM | POA: Diagnosis not present

## 2023-06-18 DIAGNOSIS — L821 Other seborrheic keratosis: Secondary | ICD-10-CM | POA: Diagnosis not present

## 2023-06-18 DIAGNOSIS — I781 Nevus, non-neoplastic: Secondary | ICD-10-CM | POA: Diagnosis not present

## 2023-06-18 DIAGNOSIS — L814 Other melanin hyperpigmentation: Secondary | ICD-10-CM | POA: Diagnosis not present

## 2023-06-24 DIAGNOSIS — Z9049 Acquired absence of other specified parts of digestive tract: Secondary | ICD-10-CM | POA: Diagnosis not present

## 2023-06-24 DIAGNOSIS — Q619 Cystic kidney disease, unspecified: Secondary | ICD-10-CM | POA: Diagnosis not present

## 2023-06-24 DIAGNOSIS — C8290 Follicular lymphoma, unspecified, unspecified site: Secondary | ICD-10-CM | POA: Diagnosis not present

## 2023-06-24 DIAGNOSIS — C8298 Follicular lymphoma, unspecified, lymph nodes of multiple sites: Secondary | ICD-10-CM | POA: Diagnosis not present

## 2023-06-26 DIAGNOSIS — Z5112 Encounter for antineoplastic immunotherapy: Secondary | ICD-10-CM | POA: Diagnosis not present

## 2023-06-26 DIAGNOSIS — D490 Neoplasm of unspecified behavior of digestive system: Secondary | ICD-10-CM | POA: Diagnosis not present

## 2023-06-26 DIAGNOSIS — Z006 Encounter for examination for normal comparison and control in clinical research program: Secondary | ICD-10-CM | POA: Diagnosis not present

## 2023-06-26 DIAGNOSIS — C8208 Follicular lymphoma grade I, lymph nodes of multiple sites: Secondary | ICD-10-CM | POA: Diagnosis not present

## 2023-07-01 DIAGNOSIS — K760 Fatty (change of) liver, not elsewhere classified: Secondary | ICD-10-CM | POA: Diagnosis not present

## 2023-07-01 DIAGNOSIS — I700 Atherosclerosis of aorta: Secondary | ICD-10-CM | POA: Diagnosis not present

## 2023-07-01 DIAGNOSIS — E669 Obesity, unspecified: Secondary | ICD-10-CM | POA: Diagnosis not present

## 2023-08-19 DIAGNOSIS — C8291 Follicular lymphoma, unspecified, lymph nodes of head, face, and neck: Secondary | ICD-10-CM | POA: Diagnosis not present

## 2023-08-19 DIAGNOSIS — R051 Acute cough: Secondary | ICD-10-CM | POA: Diagnosis not present

## 2023-08-19 DIAGNOSIS — J0100 Acute maxillary sinusitis, unspecified: Secondary | ICD-10-CM | POA: Diagnosis not present

## 2023-08-19 DIAGNOSIS — J302 Other seasonal allergic rhinitis: Secondary | ICD-10-CM | POA: Diagnosis not present

## 2023-08-21 DIAGNOSIS — Z006 Encounter for examination for normal comparison and control in clinical research program: Secondary | ICD-10-CM | POA: Diagnosis not present

## 2023-08-21 DIAGNOSIS — E785 Hyperlipidemia, unspecified: Secondary | ICD-10-CM | POA: Diagnosis not present

## 2023-08-21 DIAGNOSIS — D490 Neoplasm of unspecified behavior of digestive system: Secondary | ICD-10-CM | POA: Diagnosis not present

## 2023-09-23 DIAGNOSIS — D7589 Other specified diseases of blood and blood-forming organs: Secondary | ICD-10-CM | POA: Diagnosis not present

## 2023-09-23 DIAGNOSIS — K219 Gastro-esophageal reflux disease without esophagitis: Secondary | ICD-10-CM | POA: Diagnosis not present

## 2023-09-23 DIAGNOSIS — C8291 Follicular lymphoma, unspecified, lymph nodes of head, face, and neck: Secondary | ICD-10-CM | POA: Diagnosis not present

## 2023-09-23 DIAGNOSIS — K760 Fatty (change of) liver, not elsewhere classified: Secondary | ICD-10-CM | POA: Diagnosis not present

## 2023-09-23 DIAGNOSIS — E669 Obesity, unspecified: Secondary | ICD-10-CM | POA: Diagnosis not present

## 2023-09-23 DIAGNOSIS — K5790 Diverticulosis of intestine, part unspecified, without perforation or abscess without bleeding: Secondary | ICD-10-CM | POA: Diagnosis not present

## 2023-09-23 DIAGNOSIS — M5090 Cervical disc disorder, unspecified, unspecified cervical region: Secondary | ICD-10-CM | POA: Diagnosis not present

## 2023-09-23 DIAGNOSIS — E782 Mixed hyperlipidemia: Secondary | ICD-10-CM | POA: Diagnosis not present

## 2023-09-23 DIAGNOSIS — I700 Atherosclerosis of aorta: Secondary | ICD-10-CM | POA: Diagnosis not present

## 2023-10-16 DIAGNOSIS — Z5112 Encounter for antineoplastic immunotherapy: Secondary | ICD-10-CM | POA: Diagnosis not present

## 2023-10-16 DIAGNOSIS — Z006 Encounter for examination for normal comparison and control in clinical research program: Secondary | ICD-10-CM | POA: Diagnosis not present

## 2023-10-16 DIAGNOSIS — C8201 Follicular lymphoma grade I, lymph nodes of head, face, and neck: Secondary | ICD-10-CM | POA: Diagnosis not present

## 2023-10-16 DIAGNOSIS — D490 Neoplasm of unspecified behavior of digestive system: Secondary | ICD-10-CM | POA: Diagnosis not present

## 2023-11-09 DIAGNOSIS — R079 Chest pain, unspecified: Secondary | ICD-10-CM | POA: Diagnosis not present

## 2023-11-09 DIAGNOSIS — C8590 Non-Hodgkin lymphoma, unspecified, unspecified site: Secondary | ICD-10-CM | POA: Diagnosis not present

## 2023-11-09 DIAGNOSIS — Z20822 Contact with and (suspected) exposure to covid-19: Secondary | ICD-10-CM | POA: Diagnosis not present

## 2023-11-09 DIAGNOSIS — C8490 Mature T/NK-cell lymphomas, unspecified, unspecified site: Secondary | ICD-10-CM | POA: Diagnosis not present

## 2023-11-09 DIAGNOSIS — M549 Dorsalgia, unspecified: Secondary | ICD-10-CM | POA: Diagnosis not present

## 2023-11-09 DIAGNOSIS — R059 Cough, unspecified: Secondary | ICD-10-CM | POA: Diagnosis not present

## 2023-11-09 DIAGNOSIS — R918 Other nonspecific abnormal finding of lung field: Secondary | ICD-10-CM | POA: Diagnosis not present

## 2023-11-24 DIAGNOSIS — C8208 Follicular lymphoma grade I, lymph nodes of multiple sites: Secondary | ICD-10-CM | POA: Diagnosis not present

## 2023-11-25 DIAGNOSIS — Z006 Encounter for examination for normal comparison and control in clinical research program: Secondary | ICD-10-CM | POA: Diagnosis not present

## 2023-11-25 DIAGNOSIS — C8208 Follicular lymphoma grade I, lymph nodes of multiple sites: Secondary | ICD-10-CM | POA: Diagnosis not present

## 2023-12-04 DIAGNOSIS — Z5111 Encounter for antineoplastic chemotherapy: Secondary | ICD-10-CM | POA: Diagnosis not present

## 2023-12-04 DIAGNOSIS — C8208 Follicular lymphoma grade I, lymph nodes of multiple sites: Secondary | ICD-10-CM | POA: Diagnosis not present

## 2023-12-05 DIAGNOSIS — C8208 Follicular lymphoma grade I, lymph nodes of multiple sites: Secondary | ICD-10-CM | POA: Diagnosis not present

## 2023-12-05 DIAGNOSIS — Z5111 Encounter for antineoplastic chemotherapy: Secondary | ICD-10-CM | POA: Diagnosis not present

## 2023-12-15 DIAGNOSIS — H16223 Keratoconjunctivitis sicca, not specified as Sjogren's, bilateral: Secondary | ICD-10-CM | POA: Diagnosis not present

## 2023-12-15 DIAGNOSIS — D492 Neoplasm of unspecified behavior of bone, soft tissue, and skin: Secondary | ICD-10-CM | POA: Diagnosis not present

## 2023-12-15 DIAGNOSIS — H2513 Age-related nuclear cataract, bilateral: Secondary | ICD-10-CM | POA: Diagnosis not present

## 2023-12-15 DIAGNOSIS — H40013 Open angle with borderline findings, low risk, bilateral: Secondary | ICD-10-CM | POA: Diagnosis not present

## 2024-01-01 DIAGNOSIS — C8208 Follicular lymphoma grade I, lymph nodes of multiple sites: Secondary | ICD-10-CM | POA: Diagnosis not present

## 2024-01-29 DIAGNOSIS — C8208 Follicular lymphoma grade I, lymph nodes of multiple sites: Secondary | ICD-10-CM | POA: Diagnosis not present
